# Patient Record
Sex: Male | Born: 1995 | Race: Black or African American | Hispanic: No | Marital: Single | Smoking: Current every day smoker
Health system: Southern US, Community
[De-identification: ages and names within clinical notes are randomized; demographics above are authoritative.]

## PROBLEM LIST (undated history)

## (undated) DIAGNOSIS — R569 Unspecified convulsions: Secondary | ICD-10-CM

## (undated) HISTORY — PX: OPEN REDUCTION INTERNAL FIXATION (ORIF) HAND: SHX5991

---
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
## 2017-10-25 ENCOUNTER — Encounter (HOSPITAL_BASED_OUTPATIENT_CLINIC_OR_DEPARTMENT_OTHER): Payer: Self-pay | Admitting: Emergency Medicine

## 2017-10-25 ENCOUNTER — Other Ambulatory Visit: Payer: Self-pay

## 2017-10-25 ENCOUNTER — Emergency Department (HOSPITAL_BASED_OUTPATIENT_CLINIC_OR_DEPARTMENT_OTHER)
Admission: EM | Admit: 2017-10-25 | Discharge: 2017-10-25 | Disposition: A | Discharge status: Home / Self Care | Payer: Self-pay | Attending: Emergency Medicine | Admitting: Emergency Medicine

## 2017-10-25 DIAGNOSIS — A64 Unspecified sexually transmitted disease: Secondary | ICD-10-CM | POA: Insufficient documentation

## 2017-10-25 DIAGNOSIS — N342 Other urethritis: Secondary | ICD-10-CM | POA: Insufficient documentation

## 2017-10-25 LAB — URINALYSIS, ROUTINE W REFLEX MICROSCOPIC
Bilirubin Urine: NEGATIVE
Glucose, UA: NEGATIVE mg/dL
Ketones, ur: NEGATIVE mg/dL
Nitrite: NEGATIVE
Protein, ur: NEGATIVE mg/dL
Specific Gravity, Urine: 1.02 (ref 1.005–1.030)
pH: 7 (ref 5.0–8.0)

## 2017-10-25 LAB — URINALYSIS, MICROSCOPIC (REFLEX)

## 2017-10-25 MED ORDER — AZITHROMYCIN 250 MG PO TABS
1000.0000 mg | ORAL_TABLET | Freq: Once | ORAL | Status: AC
  Administered 2017-10-25: 1000 mg via ORAL
  Filled 2017-10-25: qty 4

## 2017-10-25 MED ORDER — CEFTRIAXONE SODIUM 250 MG IJ SOLR
250.0000 mg | Freq: Once | INTRAMUSCULAR | Status: AC
  Administered 2017-10-25: 250 mg via INTRAMUSCULAR
  Filled 2017-10-25: qty 250

## 2017-10-25 MED ORDER — ONDANSETRON 4 MG PO TBDP
4.0000 mg | ORAL_TABLET | Freq: Once | ORAL | Status: AC
  Administered 2017-10-25: 4 mg via ORAL
  Filled 2017-10-25: qty 1

## 2017-10-25 NOTE — ED Triage Notes (Signed)
"   I think I have a STD" Dysuria , this am with d/c x 2 weeks.

## 2017-10-25 NOTE — ED Provider Notes (Signed)
[facility] EMERGENCY DEPARTMENT Provider Note   CSN: [id number] Arrival date & time: 10/25/17  0702     History   Chief Complaint No chief complaint on file.   HPI Ollen GrossRonald E Golaszewski is a 21 y.o. male.  21 year old male who presents with dysuria and penile discharge.  Patient reports 2 weeks of penile discharge and he began having dysuria this morning.  He is sexually active with one partner but states that she is not having any symptoms.  He does not always use condoms.  He has never had this problem before. No skin lesions or rash in groin.  He denies any other complaints.   The history is provided by the patient.    History reviewed. No pertinent past medical history.  There are no active problems to display for this patient.   Past Surgical History:  Procedure Laterality Date  . OPEN REDUCTION INTERNAL FIXATION (ORIF) HAND Left        Home Medications    Prior to Admission medications   Not on File    Family History No family history on file.  Social History Social History   Tobacco Use  . Smoking status: Not on file  Substance Use Topics  . Alcohol use: Not on file  . Drug use: Not on file     Allergies   Peanut-containing drug products   Review of Systems Review of Systems All other systems reviewed and are negative except that which was mentioned in HPI   Physical Exam Updated Vital Signs BP (!) 133/92 (BP Location: Left Arm)   Temp 98.1 F (36.7 C) (Oral)   Resp 18   Ht 6\' 1"  (1.854 m)   Wt 78.5 kg (173 lb)   SpO2 99%   BMI 22.82 kg/m   Physical Exam  Constitutional: He is oriented to person, place, and time. He appears well-developed and well-nourished. No distress.  HENT:  Head: Normocephalic and atraumatic.  Eyes: Conjunctivae are normal.  Neck: Neck supple.  Musculoskeletal:  Scar dorsal L hand  Neurological: He is alert and oriented to person, place, and time.  Skin: Skin is warm and dry.  Psychiatric: He has  a normal mood and affect. Judgment normal.  Nursing note and vitals reviewed.    ED Treatments / Results  Labs (all labs ordered are listed, but only abnormal results are displayed) Labs Reviewed  URINALYSIS, ROUTINE W REFLEX MICROSCOPIC - Abnormal; Notable for the following components:      Result Value   Hgb urine dipstick TRACE (*)    Leukocytes, UA SMALL (*)    All other components within normal limits  URINALYSIS, MICROSCOPIC (REFLEX)  GC/CHLAMYDIA PROBE AMP ([place]) NOT AT [facility]    EKG  EKG Interpretation None       Radiology No results found.  Procedures Procedures (including critical care time)  Medications Ordered in ED Medications  ondansetron (ZOFRAN-ODT) disintegrating tablet 4 mg (not administered)  azithromycin (ZITHROMAX) tablet 1,000 mg (not administered)  cefTRIAXone (ROCEPHIN) injection 250 mg (not administered)     Initial Impression / Assessment and Plan / ED Course  I have reviewed the triage vital signs and the nursing notes.  Pertinent labs that were available during my care of the patient were reviewed by me and considered in my medical decision making (see chart for details).     Sx c/w STD. Gave CTX, azithromycin. Discussed safe sex practices and the importance of getting his partner tested and treated prior to  any future intercourse.  Referred to health department for any future STD screening needs.  Final Clinical Impressions(s) / ED Diagnoses   Final diagnoses:  Urethritis  STI (sexually transmitted infection)    ED Discharge Orders    None       Roberth Berling, Ambrose Finlandachel Morgan, MD 10/25/17 [id number]

## 2017-10-26 LAB — GC/CHLAMYDIA PROBE AMP (~~LOC~~) NOT AT ARMC
Chlamydia: POSITIVE — AB
Neisseria Gonorrhea: NEGATIVE

## 2018-02-06 ENCOUNTER — Encounter (HOSPITAL_BASED_OUTPATIENT_CLINIC_OR_DEPARTMENT_OTHER): Payer: Self-pay | Admitting: Emergency Medicine

## 2018-02-06 ENCOUNTER — Other Ambulatory Visit: Payer: Self-pay

## 2018-02-06 ENCOUNTER — Emergency Department (HOSPITAL_BASED_OUTPATIENT_CLINIC_OR_DEPARTMENT_OTHER)
Admission: EM | Admit: 2018-02-06 | Discharge: 2018-02-06 | Disposition: A | Discharge status: Home / Self Care | Payer: Self-pay | Attending: Emergency Medicine | Admitting: Emergency Medicine

## 2018-02-06 DIAGNOSIS — Z5321 Procedure and treatment not carried out due to patient leaving prior to being seen by health care provider: Secondary | ICD-10-CM | POA: Insufficient documentation

## 2018-02-06 DIAGNOSIS — J029 Acute pharyngitis, unspecified: Secondary | ICD-10-CM | POA: Insufficient documentation

## 2018-02-06 LAB — RAPID STREP SCREEN (MED CTR MEBANE ONLY): Streptococcus, Group A Screen (Direct): NEGATIVE

## 2018-02-06 NOTE — ED Notes (Signed)
Pt registration notified this RN that patient walked outside. Checked treatment area several minutes after and pt not found.

## 2018-02-06 NOTE — ED Triage Notes (Signed)
Pt c/o sore throat and feeling like his throat is swollen.

## 2018-02-06 NOTE — ED Notes (Signed)
Pt not found in lobby or treatment area. Pt d/c out

## 2018-02-09 LAB — CULTURE, GROUP A STREP (THRC)

## 2020-10-28 ENCOUNTER — Emergency Department (HOSPITAL_COMMUNITY)

## 2020-10-28 ENCOUNTER — Encounter (HOSPITAL_COMMUNITY): Payer: Self-pay | Admitting: Emergency Medicine

## 2020-10-28 ENCOUNTER — Other Ambulatory Visit: Payer: Self-pay

## 2020-10-28 ENCOUNTER — Emergency Department (HOSPITAL_COMMUNITY)
Admission: EM | Admit: 2020-10-28 | Discharge: 2020-10-28 | Disposition: A | Discharge status: Home / Self Care | Source: Home / Self Care | Attending: Emergency Medicine | Admitting: Emergency Medicine

## 2020-10-28 DIAGNOSIS — E876 Hypokalemia: Secondary | ICD-10-CM | POA: Insufficient documentation

## 2020-10-28 DIAGNOSIS — F17210 Nicotine dependence, cigarettes, uncomplicated: Secondary | ICD-10-CM | POA: Insufficient documentation

## 2020-10-28 DIAGNOSIS — G40909 Epilepsy, unspecified, not intractable, without status epilepticus: Secondary | ICD-10-CM | POA: Diagnosis not present

## 2020-10-28 DIAGNOSIS — R569 Unspecified convulsions: Secondary | ICD-10-CM | POA: Diagnosis not present

## 2020-10-28 LAB — COMPREHENSIVE METABOLIC PANEL
ALT: 47 U/L — ABNORMAL HIGH (ref 0–44)
AST: 19 U/L (ref 15–41)
Albumin: 3.3 g/dL — ABNORMAL LOW (ref 3.5–5.0)
Alkaline Phosphatase: 40 U/L (ref 38–126)
Anion gap: 8 (ref 5–15)
BUN: 9 mg/dL (ref 6–20)
CO2: 21 mmol/L — ABNORMAL LOW (ref 22–32)
Calcium: 7.1 mg/dL — ABNORMAL LOW (ref 8.9–10.3)
Chloride: 114 mmol/L — ABNORMAL HIGH (ref 98–111)
Creatinine, Ser: 0.81 mg/dL (ref 0.61–1.24)
GFR, Estimated: >60 mL/min (ref >60)
Glucose, Bld: 72 mg/dL (ref 70–99)
Potassium: 3 mmol/L — ABNORMAL LOW (ref 3.5–5.1)
Sodium: 143 mmol/L (ref 135–145)
Total Bilirubin: 0.7 mg/dL (ref 0.3–1.2)
Total Protein: 5.7 g/dL — ABNORMAL LOW (ref 6.5–8.1)

## 2020-10-28 LAB — RAPID URINE DRUG SCREEN, HOSP PERFORMED
Amphetamines: POSITIVE — AB
Barbiturates: NOT DETECTED
Benzodiazepines: NOT DETECTED
Cocaine: NOT DETECTED
Opiates: NOT DETECTED
Tetrahydrocannabinol: NOT DETECTED

## 2020-10-28 LAB — CBC
HCT: 34.3 % — ABNORMAL LOW (ref 39.0–52.0)
Hemoglobin: 11.9 g/dL — ABNORMAL LOW (ref 13.0–17.0)
MCH: 31.6 pg (ref 26.0–34.0)
MCHC: 34.7 g/dL (ref 30.0–36.0)
MCV: 91 fL (ref 80.0–100.0)
Platelets: 214 10*3/uL (ref 150–400)
RBC: 3.77 MIL/uL — ABNORMAL LOW (ref 4.22–5.81)
RDW: 12.2 % (ref 11.5–15.5)
WBC: 7.3 10*3/uL (ref 4.0–10.5)
nRBC: 0 % (ref 0.0–0.2)

## 2020-10-28 MED ORDER — CALCIUM CARBONATE ANTACID 500 MG PO CHEW
2.0000 | CHEWABLE_TABLET | Freq: Once | ORAL | Status: AC
  Administered 2020-10-28: 400 mg via ORAL
  Filled 2020-10-28: qty 2

## 2020-10-28 MED ORDER — POTASSIUM CHLORIDE CRYS ER 20 MEQ PO TBCR
40.0000 meq | EXTENDED_RELEASE_TABLET | Freq: Once | ORAL | Status: AC
  Administered 2020-10-28: 40 meq via ORAL
  Filled 2020-10-28: qty 2

## 2020-10-28 MED ORDER — LEVETIRACETAM 500 MG PO TABS: 500.0000 mg | ORAL_TABLET | Freq: Two times a day (BID) | ORAL | 0 refills | Status: DC

## 2020-10-28 MED ORDER — LEVETIRACETAM IN NACL 1000 MG/100ML IV SOLN
1000.0000 mg | Freq: Once | INTRAVENOUS | Status: AC
  Administered 2020-10-28: 1000 mg via INTRAVENOUS
  Filled 2020-10-28: qty 100

## 2020-10-28 NOTE — ED Triage Notes (Signed)
[facility] arrived from jail via EMS. Pt had 2 unwitnessed seizures in his jail cell. 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 Pt denies any recent head trauma. Pt states that he hurt his right side with the seizure.

## 2020-10-28 NOTE — Discharge Instructions (Addendum)
It was our pleasure to provide your ER care today - we hope that you feel better.  Take keppra as prescribed. Follow up with neurologist in 1-2 weeks - call office tomorrow to arrange appointment.   Avoid substance abuse.   Seizure precautions - no driving, swimming, or operating heavy machinery until cleared to do so by your doctor.   From today's labs, your potassium level is mildly low (3) - eat plenty of fruits and vegetables, and follow up with primary care doctor in 1- 2 weeks.  Your calcium level is also mildly low - eat balanced diet, consider taking multivitamin, and follow up with your doctor.   Return to ER if worse, recurrent seizures, new or severe pain, trouble breathing, or other emergency concern.

## 2020-10-28 NOTE — ED Provider Notes (Signed)
[facility]-EMERGENCY DEPT Provider Note   CSN: [id number] Arrival date & time: 10/28/20  1949     History Chief Complaint  Patient presents with  . Seizures    Earl [name] is a 24 y.o. male.  Patient presents via EMS after seizure at jail. Patient notes hx seizures as child, and off seizure medication for a year or more. Denies other recent seizure. Cannot recall name of prior seizure med. 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 Denies etoh abuse. Currently in jail, recent abnormal sleep/wake cycle. Post seizure c/o right shoulder pain. Per report, seizure lasted 1-2 minutes, generalized, contusion to lower lip. No incontinence. ?brief post ictal period, but clear on arrival to ED. Denies headache. No neck or back pain. No abd pain or nvd. No fever or chills.   The history is provided by the patient.  Seizures      History reviewed. No pertinent past medical history.  There are no problems to display for this patient.   Past Surgical History:  Procedure Laterality Date  . OPEN REDUCTION INTERNAL FIXATION (ORIF) HAND Left        History reviewed. No pertinent family history.  Social History   Tobacco Use  . Smoking status: Current Every Day Smoker    Types: Cigarettes  . Smokeless tobacco: Never Used  Vaping Use  . Vaping Use: Never used  Substance Use Topics  . Alcohol use: Yes  . Drug use: No    Home Medications Prior to Admission medications   Medication Sig Start Date End Date Taking? Authorizing Provider  UNKNOWN TO PATIENT     [provider]    Allergies    Peanut-containing drug products  Review of Systems   Review of Systems  Constitutional: Negative for fever.  HENT: Negative for sore throat.   Eyes: Negative for redness.  Respiratory: Negative for cough and shortness of breath.   Cardiovascular: Negative for chest pain.  Gastrointestinal:  Negative for abdominal pain, diarrhea, nausea and vomiting.  Genitourinary: Negative for flank pain.  Musculoskeletal: Negative for back pain and neck pain.  Skin: Negative for rash and wound.  Neurological: Positive for seizures. Negative for headaches.  Hematological: Does not bruise/bleed easily.  Psychiatric/Behavioral: Negative for confusion.    Physical Exam Updated Vital Signs BP (!) 155/90   Pulse 89   Temp 98.6 F (37 C) (Oral)   Resp 14   Ht 1.854 m (6\' 1" )   Wt 77.1 kg   SpO2 100%   BMI 22.43 kg/m   Physical Exam Vitals and nursing note reviewed.  Constitutional:      Appearance: Normal appearance. He is well-developed.  HENT:     Head: Atraumatic.     Nose: Nose normal.     Mouth/Throat:     Mouth: Mucous membranes are moist.     Pharynx: Oropharynx is clear.     Comments: No oral/tongue trauma.  Eyes:     General: No scleral icterus.    Extraocular Movements: Extraocular movements intact.     Conjunctiva/sclera: Conjunctivae normal.     Pupils: Pupils are equal, round, and reactive to light.  Neck:     Vascular: No carotid bruit.     Trachea: No tracheal deviation.  Cardiovascular:     Rate and Rhythm: Normal rate and regular rhythm.     Pulses: Normal pulses.     Heart sounds: Normal heart sounds. No  murmur heard.  No friction rub. No gallop.   Pulmonary:     Effort: Pulmonary effort is normal. No accessory muscle usage or respiratory distress.     Breath sounds: Normal breath sounds.  Chest:     Chest wall: No tenderness.  Abdominal:     General: Bowel sounds are normal. There is no distension.     Palpations: Abdomen is soft.     Tenderness: There is no abdominal tenderness. There is no guarding.  Genitourinary:    Comments: No cva tenderness. Musculoskeletal:        General: No swelling.     Cervical back: Normal range of motion and neck supple. No rigidity or tenderness.     Comments: CTLS spine, non tender, aligned, no step  off. Tenderness right shoulder, no deformity. Otherwise good rom bil ext without pain or focal bony tenderness.   Skin:    General: Skin is warm and dry.     Findings: No rash.  Neurological:     Mental Status: He is alert.     Comments: Alert, speech clear. Motor intact bil. Sensation grossly intact bil. Steady gait.   Psychiatric:        Mood and Affect: Mood normal.     ED Results / Procedures / Treatments   Labs (all labs ordered are listed, but only abnormal results are displayed) Results for orders placed or performed during the hospital encounter of 10/28/20  CBC  Result Value Ref Range   WBC 7.3 4.0 - 10.5 K/uL   RBC 3.77 (L) 4.22 - 5.81 MIL/uL   Hemoglobin 11.9 (L) 13.0 - 17.0 g/dL   HCT 96.034.3 (L) 39 - 52 %   MCV 91.0 80.0 - 100.0 fL   MCH 31.6 26.0 - 34.0 pg   MCHC 34.7 30.0 - 36.0 g/dL   RDW 45.412.2 09.811.5 - 11.915.5 %   Platelets 214 150 - 400 K/uL   nRBC 0.0 0.0 - 0.2 %  Comprehensive metabolic panel  Result Value Ref Range   Sodium 143 135 - 145 mmol/L   Potassium 3.0 (L) 3.5 - 5.1 mmol/L   Chloride 114 (H) 98 - 111 mmol/L   CO2 21 (L) 22 - 32 mmol/L   Glucose, Bld 72 70 - 99 mg/dL   BUN 9 6 - 20 mg/dL   Creatinine, Ser 1.470.81 0.61 - 1.24 mg/dL   Calcium 7.1 (L) 8.9 - 10.3 mg/dL   Total Protein 5.7 (L) 6.5 - 8.1 g/dL   Albumin 3.3 (L) 3.5 - 5.0 g/dL   AST 19 15 - 41 U/L   ALT 47 (H) 0 - 44 U/L   Alkaline Phosphatase 40 38 - 126 U/L   Total Bilirubin 0.7 0.3 - 1.2 mg/dL   GFR, Estimated >82>60 >95>60 mL/min   Anion gap 8 5 - 15  Rapid urine drug screen (hospital performed)  Result Value Ref Range   Opiates NONE DETECTED NONE DETECTED   Cocaine NONE DETECTED NONE DETECTED   Benzodiazepines NONE DETECTED NONE DETECTED   Amphetamines POSITIVE (A) NONE DETECTED   Tetrahydrocannabinol NONE DETECTED NONE DETECTED   Barbiturates NONE DETECTED NONE DETECTED   DG Shoulder Right  Result Date: 10/28/2020 CLINICAL DATA:  Seizure today. Fell from bed onto a hard floor. Right  shoulder pain. EXAM: RIGHT SHOULDER - 2+ VIEW COMPARISON:  None. FINDINGS: There is no evidence of fracture or dislocation. There is no evidence of arthropathy or other focal bone abnormality. Soft tissues are unremarkable. IMPRESSION: Negative.  Electronically Signed   By: Amie Portland M.D.   On: 10/28/2020 20:44   CT HEAD WO CONTRAST  Result Date: 10/28/2020 CLINICAL DATA:  Unwitnessed seizure, history of seizures, most recently 1 year prior EXAM: CT HEAD WITHOUT CONTRAST TECHNIQUE: Contiguous axial images were obtained from the base of the skull through the vertex without intravenous contrast. COMPARISON:  Maxillofacial CT 11/19/2007 FINDINGS: Brain: No evidence of acute infarction, hemorrhage, hydrocephalus, extra-axial collection, visible mass lesion or mass effect. Vascular: No hyperdense vessel or unexpected calcification. Skull: No calvarial fracture or suspicious osseous lesion. No scalp swelling or hematoma. Sinuses/Orbits: Mural thickening in the paranasal sinuses most pronounced in the right ethmoids, frontal sinus and left sphenoid sinus and lateral recess. No layering air-fluid levels. Mastoid air cells are predominantly clear. Middle ear cavities are clear. Included orbital structures are unremarkable. Other: None IMPRESSION: 1. No acute intracranial findings. 2. Pansinus disease. Electronically Signed   By: Kreg Shropshire M.D.   On: 10/28/2020 20:51    EKG EKG Interpretation  Date/Time:  Sunday October 28 2020 21:17:15 EST Ventricular Rate:  83 PR Interval:    QRS Duration: 90 QT Interval:  360 QTC Calculation: 423 R Axis:   75 Text Interpretation: Sinus rhythm Confirmed by Cathren Laine ([id number]) on 10/28/2020 10:07:19 PM   Radiology DG Shoulder Right  Result Date: 10/28/2020 CLINICAL DATA:  Seizure today. Fell from bed onto a hard floor. Right shoulder pain. EXAM: RIGHT SHOULDER - 2+ VIEW COMPARISON:  None. FINDINGS: There is no evidence of fracture or dislocation. There is no  evidence of arthropathy or other focal bone abnormality. Soft tissues are unremarkable. IMPRESSION: Negative. Electronically Signed   By: Amie Portland M.D.   On: 10/28/2020 20:44   CT HEAD WO CONTRAST  Result Date: 10/28/2020 CLINICAL DATA:  Unwitnessed seizure, history of seizures, most recently 1 year prior EXAM: CT HEAD WITHOUT CONTRAST TECHNIQUE: Contiguous axial images were obtained from the base of the skull through the vertex without intravenous contrast. COMPARISON:  Maxillofacial CT 11/19/2007 FINDINGS: Brain: No evidence of acute infarction, hemorrhage, hydrocephalus, extra-axial collection, visible mass lesion or mass effect. Vascular: No hyperdense vessel or unexpected calcification. Skull: No calvarial fracture or suspicious osseous lesion. No scalp swelling or hematoma. Sinuses/Orbits: Mural thickening in the paranasal sinuses most pronounced in the right ethmoids, frontal sinus and left sphenoid sinus and lateral recess. No layering air-fluid levels. Mastoid air cells are predominantly clear. Middle ear cavities are clear. Included orbital structures are unremarkable. Other: None IMPRESSION: 1. No acute intracranial findings. 2. Pansinus disease. Electronically Signed   By: Kreg Shropshire M.D.   On: 10/28/2020 20:51    Procedures Procedures (including critical care time)  Medications Ordered in ED Medications - No data to display  ED Course  I have reviewed the triage vital signs and the nursing notes.  Pertinent labs & imaging results that were available during my care of the patient were reviewed by me and considered in my medical decision making (see chart for details).    MDM Rules/Calculators/A&P                          Iv ns. Continuous pulse ox and cardiac monitoring. Seizure precautions. Stat labs. Imaging.   Reviewed nursing notes and prior charts for additional history.   Keppra iv.   Labs reviewed/interpreted by me - K mildly low. kcl po.   Ct  reviewed/interpreted by me - no hem.  XR reviewed/interpreted by  me  - no fx.   Recheck pt, alert, content, no c/o. No seizure activity. No pain.   Pt currently appears stable for d/c. Neurology f/u. Seizure precautions/no driving, etc.   Return precautions provided.    Final Clinical Impression(s) / ED Diagnoses Final diagnoses:  None    Rx / DC Orders ED Discharge Orders    None       Cathren Laine, MD 10/28/20 2208

## 2020-10-29 ENCOUNTER — Emergency Department (HOSPITAL_COMMUNITY)

## 2020-10-29 ENCOUNTER — Encounter (HOSPITAL_COMMUNITY): Payer: Self-pay | Admitting: Emergency Medicine

## 2020-10-29 ENCOUNTER — Inpatient Hospital Stay (HOSPITAL_COMMUNITY)
Admission: EM | Admit: 2020-10-29 | Discharge: 2020-10-31 | DRG: 101 | Attending: Family Medicine | Admitting: Family Medicine

## 2020-10-29 DIAGNOSIS — F1510 Other stimulant abuse, uncomplicated: Secondary | ICD-10-CM | POA: Diagnosis not present

## 2020-10-29 DIAGNOSIS — D649 Anemia, unspecified: Secondary | ICD-10-CM | POA: Diagnosis present

## 2020-10-29 DIAGNOSIS — Z79899 Other long term (current) drug therapy: Secondary | ICD-10-CM

## 2020-10-29 DIAGNOSIS — R000 Tachycardia, unspecified: Secondary | ICD-10-CM | POA: Diagnosis present

## 2020-10-29 DIAGNOSIS — R569 Unspecified convulsions: Secondary | ICD-10-CM

## 2020-10-29 DIAGNOSIS — E860 Dehydration: Secondary | ICD-10-CM | POA: Diagnosis present

## 2020-10-29 DIAGNOSIS — F17210 Nicotine dependence, cigarettes, uncomplicated: Secondary | ICD-10-CM | POA: Diagnosis present

## 2020-10-29 DIAGNOSIS — Z20822 Contact with and (suspected) exposure to covid-19: Secondary | ICD-10-CM | POA: Diagnosis present

## 2020-10-29 DIAGNOSIS — Z91010 Allergy to peanuts: Secondary | ICD-10-CM

## 2020-10-29 DIAGNOSIS — I10 Essential (primary) hypertension: Secondary | ICD-10-CM | POA: Diagnosis present

## 2020-10-29 DIAGNOSIS — M25511 Pain in right shoulder: Secondary | ICD-10-CM | POA: Diagnosis present

## 2020-10-29 DIAGNOSIS — G40909 Epilepsy, unspecified, not intractable, without status epilepticus: Principal | ICD-10-CM | POA: Diagnosis present

## 2020-10-29 DIAGNOSIS — F1590 Other stimulant use, unspecified, uncomplicated: Secondary | ICD-10-CM | POA: Diagnosis present

## 2020-10-29 HISTORY — DX: Unspecified convulsions: R56.9

## 2020-10-29 LAB — CBC
HCT: 44.9 % (ref 39.0–52.0)
Hemoglobin: 15.6 g/dL (ref 13.0–17.0)
MCH: 31 pg (ref 26.0–34.0)
MCHC: 34.7 g/dL (ref 30.0–36.0)
MCV: 89.1 fL (ref 80.0–100.0)
Platelets: 308 10*3/uL (ref 150–400)
RBC: 5.04 MIL/uL (ref 4.22–5.81)
RDW: 12.2 % (ref 11.5–15.5)
WBC: 7.9 10*3/uL (ref 4.0–10.5)
nRBC: 0 % (ref 0.0–0.2)

## 2020-10-29 LAB — COMPREHENSIVE METABOLIC PANEL
ALT: 55 U/L — ABNORMAL HIGH (ref 0–44)
AST: 26 U/L (ref 15–41)
Albumin: 4.3 g/dL (ref 3.5–5.0)
Alkaline Phosphatase: 45 U/L (ref 38–126)
Anion gap: 10 (ref 5–15)
BUN: 10 mg/dL (ref 6–20)
CO2: 25 mmol/L (ref 22–32)
Calcium: 8.9 mg/dL (ref 8.9–10.3)
Chloride: 106 mmol/L (ref 98–111)
Creatinine, Ser: 1.13 mg/dL (ref 0.61–1.24)
GFR, Estimated: >60 mL/min (ref >60)
Glucose, Bld: 100 mg/dL — ABNORMAL HIGH (ref 70–99)
Potassium: 3.7 mmol/L (ref 3.5–5.1)
Sodium: 141 mmol/L (ref 135–145)
Total Bilirubin: 1.1 mg/dL (ref 0.3–1.2)
Total Protein: 7.1 g/dL (ref 6.5–8.1)

## 2020-10-29 LAB — HIV ANTIBODY (ROUTINE TESTING W REFLEX): HIV Screen 4th Generation wRfx: NONREACTIVE

## 2020-10-29 LAB — RAPID URINE DRUG SCREEN, HOSP PERFORMED
Amphetamines: NOT DETECTED
Barbiturates: NOT DETECTED
Benzodiazepines: NOT DETECTED
Cocaine: NOT DETECTED
Opiates: NOT DETECTED
Tetrahydrocannabinol: NOT DETECTED

## 2020-10-29 LAB — RESP PANEL BY RT-PCR (FLU A&B, COVID) ARPGX2
Influenza A by PCR: NEGATIVE
Influenza B by PCR: NEGATIVE
SARS Coronavirus 2 by RT PCR: NEGATIVE

## 2020-10-29 MED ORDER — LORAZEPAM 2 MG/ML IJ SOLN
2.0000 mg | Freq: Four times a day (QID) | INTRAMUSCULAR | Status: DC | PRN
  Administered 2020-10-29: 2 mg via INTRAVENOUS
  Filled 2020-10-29: qty 1

## 2020-10-29 MED ORDER — ACETAMINOPHEN 325 MG PO TABS
650.0000 mg | ORAL_TABLET | Freq: Four times a day (QID) | ORAL | Status: DC | PRN
  Administered 2020-10-29 – 2020-10-30 (×3): 650 mg via ORAL
  Filled 2020-10-29 (×3): qty 2

## 2020-10-29 MED ORDER — SODIUM CHLORIDE 0.9 % IV SOLN: INTRAVENOUS | Status: DC

## 2020-10-29 MED ORDER — ENOXAPARIN SODIUM 40 MG/0.4ML ~~LOC~~ SOLN
40.0000 mg | SUBCUTANEOUS | Status: DC
  Administered 2020-10-29 – 2020-10-30 (×2): 40 mg via SUBCUTANEOUS
  Filled 2020-10-29 (×2): qty 0.4

## 2020-10-29 MED ORDER — ACETAMINOPHEN 325 MG PO TABS
650.0000 mg | ORAL_TABLET | Freq: Once | ORAL | Status: AC
  Administered 2020-10-29: 650 mg via ORAL
  Filled 2020-10-29 (×2): qty 2

## 2020-10-29 MED ORDER — LORAZEPAM 2 MG/ML IJ SOLN
1.0000 mg | Freq: Once | INTRAMUSCULAR | Status: AC
  Administered 2020-10-29: 1 mg via INTRAVENOUS
  Filled 2020-10-29: qty 1

## 2020-10-29 MED ORDER — HYDRALAZINE HCL 20 MG/ML IJ SOLN
10.0000 mg | Freq: Four times a day (QID) | INTRAMUSCULAR | Status: DC | PRN
  Administered 2020-10-29: 10 mg via INTRAVENOUS
  Filled 2020-10-29: qty 1

## 2020-10-29 MED ORDER — LEVETIRACETAM IN NACL 500 MG/100ML IV SOLN
500.0000 mg | Freq: Two times a day (BID) | INTRAVENOUS | Status: DC
  Administered 2020-10-29 – 2020-10-30 (×2): 500 mg via INTRAVENOUS
  Filled 2020-10-29 (×2): qty 100

## 2020-10-29 NOTE — ED Triage Notes (Signed)
BIB EMS from [facility], C/C 3 witnessed seizures,lasting about 30 seconds each, 5-7 min apart. Alert and oriented with EMS currently, was post-ictal after seizures. Had one episode of urinary incontinence.

## 2020-10-29 NOTE — ED Notes (Signed)
Pt given a meal tray 

## 2020-10-29 NOTE — ED Notes (Signed)
Seizure like activity noted by deputy pt postictal on my arrival will give PRN ativan per order condition now stable

## 2020-10-29 NOTE — H&P (Signed)
History and Physical    Earl [name] ION:[id number] DOB: 1996-08-19 DOA: 10/29/2020  PCP: Patient, No Pcp Per   Patient coming from: Jail    Chief Complaint: Seizure  HPI: Earl GrossRonald E [name] is a 24 y.o. male with medical history significant of seizure disorder but not taking any antiseizure medications who was brought from jail with 3 witnessed  events of tonic clonic seizures.  Patient actually presented here with same complaint on 10/28/2020, he was loaded with Keppra in the emergency department and was discharged.  After discharge, there was report that he had 3 more seizure episodes in the jail.  Patient has history of seizure disorder since childhood but was not taking his medication and he cannot remember the name of the medication he was taking before.  UDS done in the emergency department on 10/28/2020 showed amphetamines.  As per the report, the seizures lasted about 30 seconds and were 7 minutes apart.  Patient became postictal after seizure and also had an episode of urinary incontinence. There was report that patient hit his head during the seizure episode and was complaining of neck pain and low back pain with headache after the seizure. Patient seen and examined at the bedside in the emergency department.  During my evaluation, he was hemodynamically stable.  He was in custody. There was no report of fever, chills, nausea, vomiting, abdominal pain, dysuria, diarrhea, hematochezia.  Patient complained of headache and generalized body ache during my evaluation.  ED Course: Noted to be hypertensive and tachycardiac in the emergency department.  CT head/cervical scan/x-ray of the lumbar spine did not show any acute abnormalities.  Patient was admitted for observation.  Started on IV Keppra.  EEG ordered.  Review of Systems: As per HPI otherwise 10 point review of systems negative.    Past Medical History:  Diagnosis Date  . Seizures (HCC)     Past Surgical History:  Procedure  Laterality Date  . OPEN REDUCTION INTERNAL FIXATION (ORIF) HAND Left      reports that he has been smoking cigarettes. He has never used smokeless tobacco. He reports current alcohol use. He reports that he does not use drugs.  Allergies  Allergen Reactions  . Peanut-Containing Drug Products Anaphylaxis    hives    No family history on file.   Prior to Admission medications   Medication Sig Start Date End Date Taking? Authorizing Provider  acetaminophen (TYLENOL) 325 MG tablet Take 650 mg by mouth 3 (three) times daily as needed for moderate pain.   Yes [provider]  loperamide (IMODIUM A-D) 2 MG tablet Take 2 mg by mouth 2 (two) times daily as needed for diarrhea or loose stools.   Yes [provider]  Meclizine HCl 25 MG CHEW Chew 25 mg by mouth 3 (three) times daily as needed (nausea).   Yes [provider]  levETIRAcetam (KEPPRA) 500 MG tablet Take 1 tablet (500 mg total) by mouth 2 (two) times daily. 10/28/20   Cathren LaineSteinl, Kevin, MD  UNKNOWN TO PATIENT     [provider]    Physical Exam: Vitals:   10/29/20 1230 10/29/20 1405 10/29/20 1406 10/29/20 1545  BP:  (!) 128/94  (!) 149/112  Pulse:  95 96 95  Resp: 17 19 20  (!) 24  Temp:      TempSrc:      SpO2:  98% 97% 96%  Weight:      Height:        Constitutional:  anxious  Vitals:   10/29/20 1230 10/29/20 1405 10/29/20 1406 10/29/20 1545  BP:  (!) 128/94  (!) 149/112  Pulse:  95 96 95  Resp: 17 19 20  (!) 24  Temp:      TempSrc:      SpO2:  98% 97% 96%  Weight:      Height:       Eyes: PERRL, lids and conjunctivae normal ENMT: Mucous membranes are moist.  Neck: normal, supple, no masses, no thyromegaly Respiratory: clear to auscultation bilaterally, no wheezing, no crackles. Normal respiratory effort. No accessory muscle use.  Cardiovascular: Sinus tachcardia, no murmurs / rubs / gallops. No extremity edema.  Abdomen: no tenderness, no masses palpated. No hepatosplenomegaly.  Bowel sounds positive.  Musculoskeletal: no clubbing / cyanosis. No joint deformity upper and lower extremities.  Skin: no rashes, lesions, ulcers. No induration Neurologic: CN 2-12 grossly intact.  Strength 5/5 in all 4.  Psychiatric: Anxious. Alert and oriented x 3. Normal mood.   Foley Catheter:None  Labs on Admission: I have personally reviewed following labs and imaging studies  CBC: Recent Labs  Lab 10/28/20 2019  WBC 7.3  HGB 11.9*  HCT 34.3*  MCV 91.0  PLT 214   Basic Metabolic Panel: Recent Labs  Lab 10/28/20 2019 10/29/20 1300  NA 143 141  K 3.0* 3.7  CL 114* 106  CO2 21* 25  GLUCOSE 72 100*  BUN 9 10  CREATININE 0.81 1.13  CALCIUM 7.1* 8.9   GFR: Estimated Creatinine Clearance: 107.9 mL/min (by C-G formula based on SCr of 1.13 mg/dL). Liver Function Tests: Recent Labs  Lab 10/28/20 2019 10/29/20 1300  AST 19 26  ALT 47* 55*  ALKPHOS 40 45  BILITOT 0.7 1.1  PROT 5.7* 7.1  ALBUMIN 3.3* 4.3   No results for input(s): LIPASE, AMYLASE in the last 168 hours. No results for input(s): AMMONIA in the last 168 hours. Coagulation Profile: No results for input(s): INR, PROTIME in the last 168 hours. Cardiac Enzymes: No results for input(s): CKTOTAL, CKMB, CKMBINDEX, TROPONINI in the last 168 hours. BNP (last 3 results) No results for input(s): PROBNP in the last 8760 hours. HbA1C: No results for input(s): HGBA1C in the last 72 hours. CBG: No results for input(s): GLUCAP in the last 168 hours. Lipid Profile: No results for input(s): CHOL, HDL, LDLCALC, TRIG, CHOLHDL, LDLDIRECT in the last 72 hours. Thyroid Function Tests: No results for input(s): TSH, T4TOTAL, FREET4, T3FREE, THYROIDAB in the last 72 hours. Anemia Panel: No results for input(s): VITAMINB12, FOLATE, FERRITIN, TIBC, IRON, RETICCTPCT in the last 72 hours. Urine analysis:    Component Value Date/Time   COLORURINE YELLOW 10/25/2017 0723   APPEARANCEUR CLEAR 10/25/2017 0723   LABSPEC  1.020 10/25/2017 0723   PHURINE 7.0 10/25/2017 0723   GLUCOSEU NEGATIVE 10/25/2017 0723   HGBUR TRACE (A) 10/25/2017 0723   BILIRUBINUR NEGATIVE 10/25/2017 0723   KETONESUR NEGATIVE 10/25/2017 0723   PROTEINUR NEGATIVE 10/25/2017 0723   NITRITE NEGATIVE 10/25/2017 0723   LEUKOCYTESUR SMALL (A) 10/25/2017 0723    Radiological Exams on Admission: DG Lumbar Spine Complete  Result Date: 10/29/2020 CLINICAL DATA:  Seizures, fall EXAM: LUMBAR SPINE - COMPLETE 4+ VIEW COMPARISON:  None. FINDINGS: Lumbar vertebral body heights and alignment are preserved. There is no acute fracture. There is no evidence of pars break. Intervertebral disc spaces are preserved. There is no significant facet hypertrophy. Sacroiliac joints are unremarkable. IMPRESSION: No acute or significant abnormality. Electronically Signed   By: 14/04/2020.D.  On: 10/29/2020 13:22   DG Shoulder Right  Result Date: 10/28/2020 CLINICAL DATA:  Seizure today. Fell from bed onto a hard floor. Right shoulder pain. EXAM: RIGHT SHOULDER - 2+ VIEW COMPARISON:  None. FINDINGS: There is no evidence of fracture or dislocation. There is no evidence of arthropathy or other focal bone abnormality. Soft tissues are unremarkable. IMPRESSION: Negative. Electronically Signed   By: Amie Portland M.D.   On: 10/28/2020 20:44   CT Head Wo Contrast  Result Date: 10/29/2020 CLINICAL DATA:  Three witnessed seizures about 30 seconds each occurring 5-7 minutes apart, was post ictal following seizures but now alert and oriented, single episode urinary incontinence EXAM: CT HEAD WITHOUT CONTRAST CT CERVICAL SPINE WITHOUT CONTRAST TECHNIQUE: Multidetector CT imaging of the head and cervical spine was performed following the standard protocol without intravenous contrast. Multiplanar CT image reconstructions of the cervical spine were also generated. COMPARISON:  10/28/2020 CT head FINDINGS: CT HEAD FINDINGS Brain: Normal ventricular morphology. No midline  shift or mass effect. Normal appearance of brain parenchyma. No intracranial hemorrhage, mass lesion, evidence of acute infarction, or extra-axial fluid collection. Vascular: No hyperdense vessels Skull: Intact Sinuses/Orbits: Opacified RIGHT frontal sinus and LEFT sphenoid sinus unchanged. Other: N/A CT CERVICAL SPINE FINDINGS Alignment: Normal Skull base and vertebrae: Osseous mineralization normal. Skull base intact. Vertebral body and disc space heights maintained. Facet alignments normal. No fracture, subluxation, or bone destruction. Soft tissues and spinal canal: Prevertebral soft tissues normal thickness. Large calcification LEFT submandibular gland question sialolith. Disc levels:  No specific abnormalities Upper chest: Lung apices clear Other: N/A IMPRESSION: No acute intracranial abnormalities. 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 No acute cervical spine abnormalities. Large calcification in LEFT submandibular gland question sialolith. Electronically Signed   By: Ulyses Southward M.D.   On: 10/29/2020 15:09   CT HEAD WO CONTRAST  Result Date: 10/28/2020 CLINICAL DATA:  Unwitnessed seizure, history of seizures, most recently 1 year prior EXAM: CT HEAD WITHOUT CONTRAST TECHNIQUE: Contiguous axial images were obtained from the base of the skull through the vertex without intravenous contrast. COMPARISON:  Maxillofacial CT 11/19/2007 FINDINGS: Brain: No evidence of acute infarction, hemorrhage, hydrocephalus, extra-axial collection, visible mass lesion or mass effect. Vascular: No hyperdense vessel or unexpected calcification. Skull: No calvarial fracture or suspicious osseous lesion. No scalp swelling or hematoma. Sinuses/Orbits: Mural thickening in the paranasal sinuses most pronounced in the right ethmoids, frontal sinus and left sphenoid sinus and lateral recess. No layering air-fluid levels. Mastoid air cells are predominantly clear. Middle ear cavities are clear. Included orbital  structures are unremarkable. Other: None IMPRESSION: 1. No acute intracranial findings. 2. Pansinus disease. Electronically Signed   By: Kreg Shropshire M.D.   On: 10/28/2020 20:51   CT Cervical Spine Wo Contrast  Result Date: 10/29/2020 CLINICAL DATA:  Three witnessed seizures about 30 seconds each occurring 5-7 minutes apart, was post ictal following seizures but now alert and oriented, single episode urinary incontinence EXAM: CT HEAD WITHOUT CONTRAST CT CERVICAL SPINE WITHOUT CONTRAST TECHNIQUE: Multidetector CT imaging of the head and cervical spine was performed following the standard protocol without intravenous contrast. Multiplanar CT image reconstructions of the cervical spine were also generated. COMPARISON:  10/28/2020 CT head FINDINGS: CT HEAD FINDINGS Brain: Normal ventricular morphology. No midline shift or mass effect. Normal appearance of brain parenchyma. No intracranial hemorrhage, mass lesion, evidence of acute infarction, or extra-axial fluid collection. Vascular: No hyperdense vessels Skull: Intact Sinuses/Orbits: Opacified RIGHT frontal sinus and LEFT sphenoid sinus unchanged.  Other: N/A CT CERVICAL SPINE FINDINGS Alignment: Normal Skull base and vertebrae: Osseous mineralization normal. Skull base intact. Vertebral body and disc space heights maintained. Facet alignments normal. No fracture, subluxation, or bone destruction. Soft tissues and spinal canal: Prevertebral soft tissues normal thickness. Large calcification LEFT submandibular gland question sialolith. Disc levels:  No specific abnormalities Upper chest: Lung apices clear Other: N/A IMPRESSION: No acute intracranial abnormalities. 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 No acute cervical spine abnormalities. Large calcification in LEFT submandibular gland question sialolith. Electronically Signed   By: Ulyses Southward M.D.   On: 10/29/2020 15:09     Assessment/Plan Active Problems:   Seizure (HCC)    Seizures:  History of seizure disorder but currently not taking medications.  Had 2  seizure episodes yesterday and 3 today.  The patient was in ED yesterday and was discharged after loaded with Keppra. We will get EEG.  We will continue Keppra IV.  Seizure precaution.  IV Ativan for breakthrough seizures. Imaging bloods did not show any acute abnormality.  Sinus tachycardia: Continue gentle IV fluid:s  Substance abuse: Amphetamines positive in the UDS on 10/28/2020.  3 new seizure episodes today in the jail.  We will repeat UDS  Hypertensive: Most likely secondary to postictal state/anxiety.Continue to monitor.Continue PRN meds   Severity of Illness: The appropriate patient status for this patient is OBSERVATION.    DVT prophylaxis: Lovenox Code Status: Full Family Communication: None at bedside Consults called:ED physician discussed with neurology on phone     Burnadette Pop MD [facility]  10/29/2020, 4:20 PM

## 2020-10-29 NOTE — ED Provider Notes (Signed)
[facility]-EMERGENCY DEPT Provider Note   CSN: [id number] Arrival date & time: 10/29/20  1136     History No chief complaint on file.   Earl [name] is a 24 y.o. male with pertinent past medical history of seizures that presents via EMS after 3 witnessed seizures at jail.  Patient was seen in the emergency department yesterday and evaluated for the same, yesterday he did have 2 seizures, was loaded with Keppra and sent home with neurology follow-up.  Return to this morning with 3 witnessed seizures, per EMS they lasted about 30 seconds, they were about 7 minutes apart.  EMS also noticed that there was a postictal period after seizures and he also did have 1 episode of urinary incontinence.  Patient states that he did hit his head, is complaining currently of neck pain and lower back pain and HA.  Patient states that he has not had seizures before yesterday for the past couple of years.  Has not been taking his seizure medication.  Denies any drug use besides amphetamines.  Denies any alcohol use.  States last time he used it that means is over a month ago.  Patient did have urine drug screen yesterday which did show amphetamines only.  Denies any nausea, vomiting, abdominal pain, fevers, chills, vision changes.  Patient states that he is having a headache, generalized all over his head.  Denies any shoulder pain.  HPI     Past Medical History:  Diagnosis Date  . Seizures (HCC)     There are no problems to display for this patient.   Past Surgical History:  Procedure Laterality Date  . OPEN REDUCTION INTERNAL FIXATION (ORIF) HAND Left        No family history on file.  Social History   Tobacco Use  . Smoking status: Current Every Day Smoker    Types: Cigarettes  . Smokeless tobacco: Never Used  Vaping Use  . Vaping Use: Never used  Substance Use Topics  . Alcohol use: Yes  . Drug use: No    Home Medications Prior to Admission medications    Medication Sig Start Date End Date Taking? Authorizing Provider  levETIRAcetam (KEPPRA) 500 MG tablet Take 1 tablet (500 mg total) by mouth 2 (two) times daily. 10/28/20   Cathren Laine, MD  UNKNOWN TO PATIENT     [provider]    Allergies    Peanut-containing drug products  Review of Systems   Review of Systems  Constitutional: Negative for chills, diaphoresis, fatigue and fever.  HENT: Negative for congestion, sore throat and trouble swallowing.   Eyes: Negative for pain and visual disturbance.  Respiratory: Negative for cough, shortness of breath and wheezing.   Cardiovascular: Negative for chest pain, palpitations and leg swelling.  Gastrointestinal: Negative for abdominal distention, abdominal pain, diarrhea, nausea and vomiting.  Genitourinary: Negative for difficulty urinating.  Musculoskeletal: Positive for back pain and neck pain. Negative for neck stiffness.  Skin: Negative for pallor.  Neurological: Positive for seizures. Negative for dizziness, speech difficulty, weakness and headaches.  Psychiatric/Behavioral: Negative for confusion.    Physical Exam Updated Vital Signs BP (!) 149/112   Pulse 95   Temp (!) 97.2 F (36.2 C) (Oral)   Resp (!) 24   Ht 6\' 1"  (1.854 m)   Wt 77 kg   SpO2 96%   BMI 22.40 kg/m   Physical Exam Constitutional:      General: He is not in acute distress.  Appearance: Normal appearance. He is not ill-appearing, toxic-appearing or diaphoretic.  HENT:     Mouth/Throat:     Mouth: Mucous membranes are moist.     Pharynx: Oropharynx is clear.  Eyes:     General: No scleral icterus.    Extraocular Movements: Extraocular movements intact.     Pupils: Pupils are equal, round, and reactive to light.  Neck:      Comments: Midline tenderness to cervical spine, no para spinal muscle tenderness.  Full range of motion to neck with pain.  No crepitus. Cardiovascular:     Rate and Rhythm: Normal rate and regular rhythm.      Pulses: Normal pulses.     Heart sounds: Normal heart sounds.  Pulmonary:     Effort: Pulmonary effort is normal. No respiratory distress.     Breath sounds: Normal breath sounds. No stridor. No wheezing, rhonchi or rales.  Chest:     Chest wall: No tenderness.  Abdominal:     General: Abdomen is flat. There is no distension.     Palpations: Abdomen is soft.     Tenderness: There is no abdominal tenderness. There is no guarding or rebound.  Musculoskeletal:        General: No swelling or tenderness. Normal range of motion.     Cervical back: Normal range of motion and neck supple. No erythema or rigidity. Spinous process tenderness present. Normal range of motion.       Back:     Right lower leg: No edema.     Left lower leg: No edema.     Comments: Tenderness to palpation along lumbar spine with midline tenderness, normal range of motion to back, no objective numbness.  Skin:    General: Skin is warm and dry.     Capillary Refill: Capillary refill takes less than 2 seconds.     Coloration: Skin is not pale.  Neurological:     General: No focal deficit present.     Mental Status: He is alert and oriented to person, place, and time.  Psychiatric:        Mood and Affect: Mood normal.        Behavior: Behavior normal.     ED Results / Procedures / Treatments   Labs (all labs ordered are listed, but only abnormal results are displayed) Labs Reviewed  COMPREHENSIVE METABOLIC PANEL - Abnormal; Notable for the following components:      Result Value   Glucose, Bld 100 (*)    ALT 55 (*)    All other components within normal limits  CBC    EKG None  Radiology DG Lumbar Spine Complete  Result Date: 10/29/2020 CLINICAL DATA:  Seizures, fall EXAM: LUMBAR SPINE - COMPLETE 4+ VIEW COMPARISON:  None. FINDINGS: Lumbar vertebral body heights and alignment are preserved. There is no acute fracture. There is no evidence of pars break. Intervertebral disc spaces are preserved. There is  no significant facet hypertrophy. Sacroiliac joints are unremarkable. IMPRESSION: No acute or significant abnormality. Electronically Signed   By: Guadlupe SpanishPraneil  Madison Albea M.D.   On: 10/29/2020 13:22   DG Shoulder Right  Result Date: 10/28/2020 CLINICAL DATA:  Seizure today. Fell from bed onto a hard floor. Right shoulder pain. EXAM: RIGHT SHOULDER - 2+ VIEW COMPARISON:  None. FINDINGS: There is no evidence of fracture or dislocation. There is no evidence of arthropathy or other focal bone abnormality. Soft tissues are unremarkable. IMPRESSION: Negative. Electronically Signed   By: Renard Hamperavid  Ormond M.D.  On: 10/28/2020 20:44   CT Head Wo Contrast  Result Date: 10/29/2020 CLINICAL DATA:  Three witnessed seizures about 30 seconds each occurring 5-7 minutes apart, was post ictal following seizures but now alert and oriented, single episode urinary incontinence EXAM: CT HEAD WITHOUT CONTRAST CT CERVICAL SPINE WITHOUT CONTRAST TECHNIQUE: Multidetector CT imaging of the head and cervical spine was performed following the standard protocol without intravenous contrast. Multiplanar CT image reconstructions of the cervical spine were also generated. COMPARISON:  10/28/2020 CT head FINDINGS: CT HEAD FINDINGS Brain: Normal ventricular morphology. No midline shift or mass effect. Normal appearance of brain parenchyma. No intracranial hemorrhage, mass lesion, evidence of acute infarction, or extra-axial fluid collection. Vascular: No hyperdense vessels Skull: Intact Sinuses/Orbits: Opacified RIGHT frontal sinus and LEFT sphenoid sinus unchanged. Other: N/A CT CERVICAL SPINE FINDINGS Alignment: Normal Skull base and vertebrae: Osseous mineralization normal. Skull base intact. Vertebral body and disc space heights maintained. Facet alignments normal. No fracture, subluxation, or bone destruction. Soft tissues and spinal canal: Prevertebral soft tissues normal thickness. Large calcification LEFT submandibular gland question sialolith.  Disc levels:  No specific abnormalities Upper chest: Lung apices clear Other: N/A IMPRESSION: No acute intracranial abnormalities. 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 No acute cervical spine abnormalities. Large calcification in LEFT submandibular gland question sialolith. Electronically Signed   By: Ulyses Southward M.D.   On: 10/29/2020 15:09   CT HEAD WO CONTRAST  Result Date: 10/28/2020 CLINICAL DATA:  Unwitnessed seizure, history of seizures, most recently 1 year prior EXAM: CT HEAD WITHOUT CONTRAST TECHNIQUE: Contiguous axial images were obtained from the base of the skull through the vertex without intravenous contrast. COMPARISON:  Maxillofacial CT 11/19/2007 FINDINGS: Brain: No evidence of acute infarction, hemorrhage, hydrocephalus, extra-axial collection, visible mass lesion or mass effect. Vascular: No hyperdense vessel or unexpected calcification. Skull: No calvarial fracture or suspicious osseous lesion. No scalp swelling or hematoma. Sinuses/Orbits: Mural thickening in the paranasal sinuses most pronounced in the right ethmoids, frontal sinus and left sphenoid sinus and lateral recess. No layering air-fluid levels. Mastoid air cells are predominantly clear. Middle ear cavities are clear. Included orbital structures are unremarkable. Other: None IMPRESSION: 1. No acute intracranial findings. 2. Pansinus disease. Electronically Signed   By: Kreg Shropshire M.D.   On: 10/28/2020 20:51   CT Cervical Spine Wo Contrast  Result Date: 10/29/2020 CLINICAL DATA:  Three witnessed seizures about 30 seconds each occurring 5-7 minutes apart, was post ictal following seizures but now alert and oriented, single episode urinary incontinence EXAM: CT HEAD WITHOUT CONTRAST CT CERVICAL SPINE WITHOUT CONTRAST TECHNIQUE: Multidetector CT imaging of the head and cervical spine was performed following the standard protocol without intravenous contrast. Multiplanar CT image reconstructions of the cervical  spine were also generated. COMPARISON:  10/28/2020 CT head FINDINGS: CT HEAD FINDINGS Brain: Normal ventricular morphology. No midline shift or mass effect. Normal appearance of brain parenchyma. No intracranial hemorrhage, mass lesion, evidence of acute infarction, or extra-axial fluid collection. Vascular: No hyperdense vessels Skull: Intact Sinuses/Orbits: Opacified RIGHT frontal sinus and LEFT sphenoid sinus unchanged. Other: N/A CT CERVICAL SPINE FINDINGS Alignment: Normal Skull base and vertebrae: Osseous mineralization normal. Skull base intact. Vertebral body and disc space heights maintained. Facet alignments normal. No fracture, subluxation, or bone destruction. Soft tissues and spinal canal: Prevertebral soft tissues normal thickness. Large calcification LEFT submandibular gland question sialolith. Disc levels:  No specific abnormalities Upper chest: Lung apices clear Other: N/A IMPRESSION: No acute intracranial abnormalities. Chronic RIGHT frontal and  LEFT sphenoid sinus disease. No acute cervical spine abnormalities. Large calcification in LEFT submandibular gland question sialolith. Electronically Signed   By: Ulyses Southward M.D.   On: 10/29/2020 15:09    Procedures Procedures (including critical care time)  Medications Ordered in ED Medications  LORazepam (ATIVAN) injection 1 mg (1 mg Intravenous Given 10/29/20 1333)  acetaminophen (TYLENOL) tablet 650 mg (650 mg Oral Given 10/29/20 1517)    ED Course  I have reviewed the triage vital signs and the nursing notes.  Pertinent labs & imaging results that were available during my care of the patient were reviewed by me and considered in my medical decision making (see chart for details).    MDM Rules/Calculators/A&P                          Earl [name] is a 24 y.o. male with pertinent past medical history of seizures that presents via EMS after 3 witnessed seizures at jail.  Will give Ativan at this time, labs and imaging ordered.  CMP  unremarkable, patient did have amphetamines in urine yesterday, however when speaking patient he states that he has not had amphetamines in the past couple of weeks, has not had any while in jail overnight.  Imaging without any acute fracture or dislocation of back, CT head and neck negative for fracture any intracranial abnormality.  We will consult neurology at this time since patient did have 3 seizures this morning with 2 seizures yesterday. Has not had any seizures while here.   400 spoke to Dr. [name], neurology who recommended observation overnight.  States that patient has 1 more seizure than patient will need transfer over to [facility] for EEG monitoring.   416 spoke to Dr. Renford Dills, hospitalist who accept the patient for observation.  The patient appears reasonably stabilized for admission considering the current resources, flow, and capabilities available in the ED at this time, and I doubt any other [facility] requiring further screening and/or treatment in the ED prior to admission.  I discussed this case with my attending physician who cosigned this note including patient's presenting symptoms, physical exam, and planned diagnostics and interventions. Attending physician stated agreement with plan or made changes to plan which were implemented.   Attending physician assessed patient at bedside.  Final Clinical Impression(s) / ED Diagnoses Final diagnoses:  Seizure [facility])    Rx / DC Orders ED Discharge Orders    None       Farrel Gordon, PA-C 10/29/20 1618    Sabas Sous, MD 10/29/20 [phone]

## 2020-10-29 NOTE — Consult Note (Signed)
Neurology Consultation Reason for Consult: Seizures Referring Physician: Eliane Decree  CC: Seizures  History is obtained from: Patient, chart review  HPI: Earl [name] is a 24 y.o. male with a history of seizures since childhood, he does not remember how old he was when his first seizure occurred as he was too young.  He was on seizure medicine until he left his mother's house, he estimates around 66.  He occasionally has seizures, the most recent one about a year ago before yesterday.  Yesterday he had multiple seizures and was given Keppra, and UDS was positive for amphetamines (the patient states it has been a month).  Today, he had recurrent seizures and was brought into the emergency department again.   He states that he does get a lightheaded type feeling and feels flushed and has some abnormal sensation in his chest just prior to his seizures.  He then awakens with people already crowding around him.  He denies tongue biting, but did have urinary incontinence.   ROS: A 14 point ROS was performed and is negative except as noted in the HPI.  Past Medical History:  Diagnosis Date  . Seizures (HCC)      Family history: Brother-heart issue   Social History:  reports that he has been smoking cigarettes. He has never used smokeless tobacco. He reports current alcohol use. He reports that he does not use drugs. Amphetamines, most recent use a month ago.  Exam: Current vital signs: BP (!) 143/89   Pulse (!) 115   Temp (!) 97.2 F (36.2 C) (Oral)   Resp 16   Ht 6\' 1"  (1.854 m)   Wt 77 kg   SpO2 99%   BMI 22.40 kg/m  Vital signs in last 24 hours: Temp:  [97.2 F (36.2 C)-98.6 F (37 C)] 97.2 F (36.2 C) (12/06 1145) Pulse Rate:  [82-115] 115 (12/06 1800) Resp:  [14-28] 16 (12/06 1800) BP: (128-155)/(89-119) 143/89 (12/06 1800) SpO2:  [95 %-100 %] 99 % (12/06 1800) Weight:  [77 kg-77.1 kg] 77 kg (12/06 1156)   Physical Exam  Constitutional: Appears well-developed and  well-nourished.  Psych: Affect appropriate to situation Eyes: No scleral injection HENT: No OP obstrucion MSK: no joint deformities.  Cardiovascular: Normal rate and regular rhythm.  Respiratory: Effort normal, non-labored breathing GI: Soft.  No distension. There is no tenderness.  Skin: WDI  Neuro: Mental Status: Patient is awake, alert, oriented to person, place, month, year, and situation. Patient is able to give a clear and coherent history. No signs of aphasia or neglect Cranial Nerves: II: Visual Fields are full. Pupils are equal, round, and reactive to light.   III,IV, VI: EOMI without ptosis or diploplia.  V: Facial sensation is symmetric to temperature VII: Facial movement is symmetric.  VIII: hearing is intact to voice X: Uvula elevates symmetrically XI: Shoulder shrug is symmetric. XII: tongue is midline without atrophy or fasciculations.  Motor: Tone is normal. Bulk is normal. 5/5 strength was present in all four extremities.  Sensory: Sensation is symmetric to light touch and temperature in the arms and legs. Cerebellar: FNF intact on the right, restricted by cuffs on the left   I have reviewed labs in epic and the results pertinent to this consultation are: CMP-unremarkable UDS-negative for amphetamines  I have reviewed the images obtained: CT head-no acute intracranial abnormalities  Impression: 24 year old male with a history of epilepsy since childhood with recurrent seizures in the setting of recent amphetamine use.  Regardless of  recent amphetamine use, he should be on antiepileptic therapy, and I would favor continuing Keppra.  Routine EEG to try and determine generalized versus focal epilepsy might be helpful.  Recommendations: 1) Keppra 500 mg twice daily 2) routine EEG   Ritta Slot, MD [facility] [phone]  If 7pm- 7am, please page neurology on call as listed in AMION.

## 2020-10-30 ENCOUNTER — Encounter (HOSPITAL_COMMUNITY): Payer: Self-pay | Admitting: Internal Medicine

## 2020-10-30 ENCOUNTER — Inpatient Hospital Stay (HOSPITAL_COMMUNITY)

## 2020-10-30 DIAGNOSIS — M25511 Pain in right shoulder: Secondary | ICD-10-CM | POA: Diagnosis present

## 2020-10-30 DIAGNOSIS — G40909 Epilepsy, unspecified, not intractable, without status epilepticus: Secondary | ICD-10-CM | POA: Diagnosis present

## 2020-10-30 DIAGNOSIS — F17210 Nicotine dependence, cigarettes, uncomplicated: Secondary | ICD-10-CM | POA: Diagnosis present

## 2020-10-30 DIAGNOSIS — Z91010 Allergy to peanuts: Secondary | ICD-10-CM | POA: Diagnosis not present

## 2020-10-30 DIAGNOSIS — Z20822 Contact with and (suspected) exposure to covid-19: Secondary | ICD-10-CM | POA: Diagnosis present

## 2020-10-30 DIAGNOSIS — R000 Tachycardia, unspecified: Secondary | ICD-10-CM | POA: Diagnosis present

## 2020-10-30 DIAGNOSIS — F1590 Other stimulant use, unspecified, uncomplicated: Secondary | ICD-10-CM | POA: Diagnosis present

## 2020-10-30 DIAGNOSIS — F1510 Other stimulant abuse, uncomplicated: Secondary | ICD-10-CM | POA: Diagnosis not present

## 2020-10-30 DIAGNOSIS — I10 Essential (primary) hypertension: Secondary | ICD-10-CM | POA: Diagnosis present

## 2020-10-30 DIAGNOSIS — E860 Dehydration: Secondary | ICD-10-CM | POA: Diagnosis present

## 2020-10-30 DIAGNOSIS — Z79899 Other long term (current) drug therapy: Secondary | ICD-10-CM | POA: Diagnosis not present

## 2020-10-30 DIAGNOSIS — D649 Anemia, unspecified: Secondary | ICD-10-CM | POA: Diagnosis present

## 2020-10-30 DIAGNOSIS — R569 Unspecified convulsions: Secondary | ICD-10-CM | POA: Diagnosis present

## 2020-10-30 LAB — CBC WITH DIFFERENTIAL/PLATELET
Abs Immature Granulocytes: 0.03 10*3/uL (ref 0.00–0.07)
Basophils Absolute: 0 10*3/uL (ref 0.0–0.1)
Basophils Relative: 0 %
Eosinophils Absolute: 0.1 10*3/uL (ref 0.0–0.5)
Eosinophils Relative: 2 %
HCT: 45.4 % (ref 39.0–52.0)
Hemoglobin: 16 g/dL (ref 13.0–17.0)
Immature Granulocytes: 0 %
Lymphocytes Relative: 37 %
Lymphs Abs: 2.5 10*3/uL (ref 0.7–4.0)
MCH: 30.4 pg (ref 26.0–34.0)
MCHC: 35.2 g/dL (ref 30.0–36.0)
MCV: 86.1 fL (ref 80.0–100.0)
Monocytes Absolute: 0.8 10*3/uL (ref 0.1–1.0)
Monocytes Relative: 12 %
Neutro Abs: 3.3 10*3/uL (ref 1.7–7.7)
Neutrophils Relative %: 49 %
Platelets: 301 10*3/uL (ref 150–400)
RBC: 5.27 MIL/uL (ref 4.22–5.81)
RDW: 12.1 % (ref 11.5–15.5)
WBC: 6.8 10*3/uL (ref 4.0–10.5)
nRBC: 0 % (ref 0.0–0.2)

## 2020-10-30 LAB — CBC
HCT: 28.2 % — ABNORMAL LOW (ref 39.0–52.0)
Hemoglobin: 9.9 g/dL — ABNORMAL LOW (ref 13.0–17.0)
MCH: 31.4 pg (ref 26.0–34.0)
MCHC: 35.1 g/dL (ref 30.0–36.0)
MCV: 89.5 fL (ref 80.0–100.0)
Platelets: 176 10*3/uL (ref 150–400)
RBC: 3.15 MIL/uL — ABNORMAL LOW (ref 4.22–5.81)
RDW: 12.3 % (ref 11.5–15.5)
WBC: 5.4 10*3/uL (ref 4.0–10.5)
nRBC: 0.6 % — ABNORMAL HIGH (ref 0.0–0.2)

## 2020-10-30 LAB — BASIC METABOLIC PANEL
Anion gap: 9 (ref 5–15)
BUN: 12 mg/dL (ref 6–20)
CO2: 22 mmol/L (ref 22–32)
Calcium: 8.5 mg/dL — ABNORMAL LOW (ref 8.9–10.3)
Chloride: 108 mmol/L (ref 98–111)
Creatinine, Ser: 1.04 mg/dL (ref 0.61–1.24)
GFR, Estimated: >60 mL/min (ref >60)
Glucose, Bld: 90 mg/dL (ref 70–99)
Potassium: 3.6 mmol/L (ref 3.5–5.1)
Sodium: 139 mmol/L (ref 135–145)

## 2020-10-30 MED ORDER — LEVETIRACETAM IN NACL 1000 MG/100ML IV SOLN
1000.0000 mg | Freq: Two times a day (BID) | INTRAVENOUS | Status: DC
  Administered 2020-10-30 – 2020-10-31 (×2): 1000 mg via INTRAVENOUS
  Filled 2020-10-30 (×2): qty 100

## 2020-10-30 MED ORDER — LEVETIRACETAM IN NACL 500 MG/100ML IV SOLN
500.0000 mg | Freq: Once | INTRAVENOUS | Status: AC
  Administered 2020-10-30: 500 mg via INTRAVENOUS
  Filled 2020-10-30: qty 100

## 2020-10-30 NOTE — ED Notes (Signed)
Attempted report x1, per receiving unit, room not ready yet.

## 2020-10-30 NOTE — ED Notes (Signed)
Pt provided breakfast tray.

## 2020-10-30 NOTE — Progress Notes (Signed)
EEG complete - results pending 

## 2020-10-30 NOTE — ED Notes (Signed)
Pt asleep, 2 sheriff's officers at bedside.

## 2020-10-30 NOTE — Procedures (Signed)
Patient Name: Earl [name]  MRN: [id number]  Epilepsy Attending: Charlsie Quest  Referring Physician/Provider: Dr Burnadette Pop Date: 10/30/2020 Duration: 22.45 mins  Patient history: 24 year old male with a history of seizures with the sudden abrupt increase in seizure frequency despite addition of Keppra. EEG to evaluate for seizure  Level of alertness: Awake  AEDs during EEG study: LEV  Technical aspects: This EEG study was done with scalp electrodes positioned according to the 10-20 International system of electrode placement. Electrical activity was acquired at a sampling rate of 500Hz  and reviewed with a high frequency filter of 70Hz  and a low frequency filter of 1Hz . EEG data were recorded continuously and digitally stored.   Description: The posterior dominant rhythm consists of 10 Hz activity of moderate voltage (25-35 uV) seen predominantly in posterior head regions, symmetric and reactive to eye opening and eye closing.  Hyperventilation and photic stimulation were not performed.     IMPRESSION: This study is within normal limits. No seizures or epileptiform discharges were seen throughout the recording.  Eowyn Tabone 

## 2020-10-30 NOTE — Progress Notes (Signed)
vLTM setup following baseline EEG   Same leads  Neurology notified  Event button tested   [facility] to monitor

## 2020-10-30 NOTE — ED Notes (Signed)
[facility] notified of need for transport. °

## 2020-10-30 NOTE — Progress Notes (Signed)
Subjective:  again had seizure last night  Exam: Vitals:   10/30/20 1015 10/30/20 1101  BP: (!) 142/94 (!) 157/97  Pulse: 100 (!) 104  Resp: 19 18  Temp: 99.1 F (37.3 C) 98.5 F (36.9 C)  SpO2: 100% 99%   Gen: In bed, NAD Resp: non-labored breathing, no acute distress Abd: soft, nt  Neuro: MS: awake, alert, interactive and appropriate CN: EOMI, face symmetric Motor: Moves all extremities well Sensory: Intact to light touch  Pertinent Labs: BMP- unremarkable other than borderline calcium (albumin also low)  Impression: 24 year old male with a history of seizures with the sudden abrupt increase in seizure frequency despite addition of Keppra.  It is possible that his amphetamine use is responsible for his sudden increase in seizure frequency.  Given his report that it has been quite sometime since use, I also wonder about secondary gain from nonepileptic seizures, though at this point I do not have a strong suspicion for this.  Given lack of response to treatment, however, I have asked him to be transferred from [facility] to [facility] for continuous EEG monitoring to further characterize the spells.  Recommendations: 1) continuous EEG 2) Keppra increased to 1 g twice daily  Ritta Slot, MD [facility] [phone]  If 7pm- 7am, please page neurology on call as listed in AMION.

## 2020-10-30 NOTE — Progress Notes (Addendum)
PROGRESS NOTE    Earl [name]  ZOX:[id number] DOB: 01/01/1996 DOA: 10/29/2020 PCP: Patient, No Pcp Per   Chief Complain:Seizure   Brief Narrative: Earl [name] is a 24 y.o. male with medical history significant of seizure disorder but not taking any antiseizure medications who was brought from jail( currently in probation) with 3 witnessed  events of tonic clonic seizures.  Patient actually presented here with same complaint on 10/28/2020, he was loaded with Keppra in the emergency department and was discharged.  After discharge, there was report that he had 3 more seizure episodes in the jail.  Patient has history of seizure disorder since childhood but was not taking his medication and he cannot remember the name of the medication he was taking before.  UDS done in the emergency department on 10/28/2020 showed amphetamines.  As per the report, patient had another seizure episode on the evening of 10/29/2020.  Neurology recommended to transfer the patient to [facility] for  LTM EEG/video EEG.    Assessment & Plan:   Active Problems:   Seizure (HCC)   Seizures: History of seizure disorder but currently not taking medications.  Had 2  seizure episodes on 10/28/20 and  and 3 on 10/29/20.had another seizure in the evening of 10/29/2020 but without any clear report from the nursing staff.  We will continue Keppra IV.  Seizure precautions.  IV Ativan for breakthrough seizures. Imagings done in the emergency department did not show any acute abnormality. Neurology following and recommended to [facility].  Neurology will continue to follow.  Sinus tachycardia:Cud be from dehydration,decreased oral intake. Continue gentle IV fluids  Substance abuse: Amphetamines positive in the UDS on 10/28/2020.   Hypertension: Most likely secondary to postictal state/stress/anxiety.Continue to monitor.Continue PRN meds.    Normocytic anemia: Hemoglobin in the range of 9 today.  Yesterday it was  showing 15.  I suspect that this is an error.  We will repeat CBC.  No suspicion for acute blood loss.          DVT prophylaxis:Lovenox Code Status: Full Family Communication: None at bedside Status is: Observation  The patient remains OBS appropriate and will d/c before 2 midnights.  Dispo: The patient is from: jail              Anticipated d/c is to: jail              Anticipated d/c date is: 2days               Patient currently is not medically stable to d/c.     Consultants: Neurology  Procedures: None  Antimicrobials:  Anti-infectives (From admission, onward)   None      Subjective: Patient seen and examined at the bedside in the emergency department.  Overall hemodynamically stable during my evaluation, sleeping when I entered the room.  Police were at the bedside.  Woke up when I called his name  and denies any complaints.  Alert and oriented  Objective: Vitals:   10/30/20 0815 10/30/20 0915 10/30/20 1015 10/30/20 1101  BP: (!) 138/97 126/88 (!) 142/94 (!) 157/97  Pulse: 92 (!) 107 100 (!) 104  Resp: (!) 21 (!) 24 19 18   Temp:   99.1 F (37.3 C) 98.5 F (36.9 C)  TempSrc:    Oral  SpO2: 97% 99% 100% 99%  Weight:      Height:        Intake/Output Summary (Last 24 hours) at 10/30/2020 1133  Last data filed at 10/30/2020 9735 Gross per 24 hour  Intake 100 ml  Output --  Net 100 ml   Filed Weights   10/29/20 1156  Weight: 77 kg    Examination:  General exam: Appears calm and comfortable ,Not in distress HEENT:PERRL,Oral mucosa moist, Ear/Nose normal on gross exam Respiratory system: Bilateral equal air entry, normal vesicular breath sounds, no wheezes or crackles  Cardiovascular system: S1 & S2 heard, RRR. No JVD, murmurs, rubs, gallops or clicks. No pedal edema. Gastrointestinal system: Abdomen is nondistended, soft and nontender. No organomegaly or masses felt. Normal bowel sounds heard. Central nervous system: Alert and oriented. No focal  neurological deficits. Extremities: No edema, no clubbing ,no cyanosis, distal peripheral pulses palpable. Skin: Tatoos,No rashes, lesions or ulcers,no icterus ,no pallor   Data Reviewed: I have personally reviewed following labs and imaging studies  CBC: Recent Labs  Lab 10/28/20 2019 10/29/20 1515 10/30/20 0500  WBC 7.3 7.9 5.4  HGB 11.9* 15.6 9.9*  HCT 34.3* 44.9 28.2*  MCV 91.0 89.1 89.5  PLT 214 308 176   Basic Metabolic Panel: Recent Labs  Lab 10/28/20 2019 10/29/20 1300 10/30/20 0500  NA 143 141 139  K 3.0* 3.7 3.6  CL 114* 106 108  CO2 21* 25 22  GLUCOSE 72 100* 90  BUN 9 10 12   CREATININE 0.81 1.13 1.04  CALCIUM 7.1* 8.9 8.5*   GFR: Estimated Creatinine Clearance: 117.2 mL/min (by C-G formula based on SCr of 1.04 mg/dL). Liver Function Tests: Recent Labs  Lab 10/28/20 2019 10/29/20 1300  AST 19 26  ALT 47* 55*  ALKPHOS 40 45  BILITOT 0.7 1.1  PROT 5.7* 7.1  ALBUMIN 3.3* 4.3   No results for input(s): LIPASE, AMYLASE in the last 168 hours. No results for input(s): AMMONIA in the last 168 hours. Coagulation Profile: No results for input(s): INR, PROTIME in the last 168 hours. Cardiac Enzymes: No results for input(s): CKTOTAL, CKMB, CKMBINDEX, TROPONINI in the last 168 hours. BNP (last 3 results) No results for input(s): PROBNP in the last 8760 hours. HbA1C: No results for input(s): HGBA1C in the last 72 hours. CBG: No results for input(s): GLUCAP in the last 168 hours. Lipid Profile: No results for input(s): CHOL, HDL, LDLCALC, TRIG, CHOLHDL, LDLDIRECT in the last 72 hours. Thyroid Function Tests: No results for input(s): TSH, T4TOTAL, FREET4, T3FREE, THYROIDAB in the last 72 hours. Anemia Panel: No results for input(s): VITAMINB12, FOLATE, FERRITIN, TIBC, IRON, RETICCTPCT in the last 72 hours. Sepsis Labs: No results for input(s): PROCALCITON, LATICACIDVEN in the last 168 hours.  Recent Results (from the past 240 hour(s))  Resp Panel by  RT-PCR (Flu A&B, Covid) Nasopharyngeal Swab     Status: None   Collection Time: 10/29/20  4:08 PM   Specimen: Nasopharyngeal Swab; Nasopharyngeal(NP) swabs in vial transport medium  Result Value Ref Range Status   SARS Coronavirus 2 by RT PCR NEGATIVE NEGATIVE Final    Comment: (NOTE) SARS-CoV-2 target nucleic acids are NOT DETECTED.  The SARS-CoV-2 RNA is generally detectable in upper respiratory specimens during the acute phase of infection. The lowest concentration of SARS-CoV-2 viral copies this assay can detect is 138 copies/mL. A negative result does not preclude SARS-Cov-2 infection and should not be used as the sole basis for treatment or other patient management decisions. A negative result may occur with  improper specimen collection/handling, submission of specimen other than nasopharyngeal swab, presence of viral mutation(s) within the areas targeted by this assay, and  inadequate number of viral copies(<138 copies/mL). A negative result must be combined with clinical observations, patient history, and epidemiological information. The expected result is Negative.  Fact Sheet for Patients:  BloggerCourse.com  Fact Sheet for Healthcare Providers:  SeriousBroker.it  This test is no t yet approved or cleared by the [place] FDA and  has been authorized for detection and/or diagnosis of SARS-CoV-2 by FDA under an Emergency Use Authorization (EUA). This EUA will remain  in effect (meaning this test can be used) for the duration of the COVID-19 declaration under Section 564(b)(1) of the Act, 21 U.S.C.section 360bbb-3(b)(1), unless the authorization is terminated  or revoked sooner.       Influenza A by PCR NEGATIVE NEGATIVE Final   Influenza B by PCR NEGATIVE NEGATIVE Final    Comment: (NOTE) The Xpert Xpress SARS-CoV-2/FLU/RSV plus assay is intended as an aid in the diagnosis of influenza from Nasopharyngeal swab  specimens and should not be used as a sole basis for treatment. Nasal washings and aspirates are unacceptable for Xpert Xpress SARS-CoV-2/FLU/RSV testing.  Fact Sheet for Patients: BloggerCourse.com  Fact Sheet for Healthcare Providers: SeriousBroker.it  This test is not yet approved or cleared by the [place] FDA and has been authorized for detection and/or diagnosis of SARS-CoV-2 by FDA under an Emergency Use Authorization (EUA). This EUA will remain in effect (meaning this test can be used) for the duration of the COVID-19 declaration under Section 564(b)(1) of the Act, 21 U.S.C. section 360bbb-3(b)(1), unless the authorization is terminated or revoked.  Performed at [facility], [address] [place], [place] [address]          Radiology Studies: DG Lumbar Spine Complete  Result Date: 10/29/2020 CLINICAL DATA:  Seizures, fall EXAM: LUMBAR SPINE - COMPLETE 4+ VIEW COMPARISON:  None. FINDINGS: Lumbar vertebral body heights and alignment are preserved. There is no acute fracture. There is no evidence of pars break. Intervertebral disc spaces are preserved. There is no significant facet hypertrophy. Sacroiliac joints are unremarkable. IMPRESSION: No acute or significant abnormality. Electronically Signed   By: Guadlupe Spanish M.D.   On: 10/29/2020 13:22   DG Shoulder Right  Result Date: 10/28/2020 CLINICAL DATA:  Seizure today. Fell from bed onto a hard floor. Right shoulder pain. EXAM: RIGHT SHOULDER - 2+ VIEW COMPARISON:  None. FINDINGS: There is no evidence of fracture or dislocation. There is no evidence of arthropathy or other focal bone abnormality. Soft tissues are unremarkable. IMPRESSION: Negative. Electronically Signed   By: Amie Portland M.D.   On: 10/28/2020 20:44   CT Head Wo Contrast  Result Date: 10/29/2020 CLINICAL DATA:  Three witnessed seizures about 30 seconds each occurring 5-7  minutes apart, was post ictal following seizures but now alert and oriented, single episode urinary incontinence EXAM: CT HEAD WITHOUT CONTRAST CT CERVICAL SPINE WITHOUT CONTRAST TECHNIQUE: Multidetector CT imaging of the head and cervical spine was performed following the standard protocol without intravenous contrast. Multiplanar CT image reconstructions of the cervical spine were also generated. COMPARISON:  10/28/2020 CT head FINDINGS: CT HEAD FINDINGS Brain: Normal ventricular morphology. No midline shift or mass effect. Normal appearance of brain parenchyma. No intracranial hemorrhage, mass lesion, evidence of acute infarction, or extra-axial fluid collection. Vascular: No hyperdense vessels Skull: Intact Sinuses/Orbits: Opacified RIGHT frontal sinus and LEFT sphenoid sinus unchanged. Other: N/A CT CERVICAL SPINE FINDINGS Alignment: Normal Skull base and vertebrae: Osseous mineralization normal. Skull base intact. Vertebral body and disc space heights maintained. Facet alignments normal.  No fracture, subluxation, or bone destruction. Soft tissues and spinal canal: Prevertebral soft tissues normal thickness. Large calcification LEFT submandibular gland question sialolith. Disc levels:  No specific abnormalities Upper chest: Lung apices clear Other: N/A IMPRESSION: No acute intracranial abnormalities. 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 No acute cervical spine abnormalities. Large calcification in LEFT submandibular gland question sialolith. Electronically Signed   By: Ulyses Southward M.D.   On: 10/29/2020 15:09   CT HEAD WO CONTRAST  Result Date: 10/28/2020 CLINICAL DATA:  Unwitnessed seizure, history of seizures, most recently 1 year prior EXAM: CT HEAD WITHOUT CONTRAST TECHNIQUE: Contiguous axial images were obtained from the base of the skull through the vertex without intravenous contrast. COMPARISON:  Maxillofacial CT 11/19/2007 FINDINGS: Brain: No evidence of acute infarction,  hemorrhage, hydrocephalus, extra-axial collection, visible mass lesion or mass effect. Vascular: No hyperdense vessel or unexpected calcification. Skull: No calvarial fracture or suspicious osseous lesion. No scalp swelling or hematoma. Sinuses/Orbits: Mural thickening in the paranasal sinuses most pronounced in the right ethmoids, frontal sinus and left sphenoid sinus and lateral recess. No layering air-fluid levels. Mastoid air cells are predominantly clear. Middle ear cavities are clear. Included orbital structures are unremarkable. Other: None IMPRESSION: 1. No acute intracranial findings. 2. Pansinus disease. Electronically Signed   By: Kreg Shropshire M.D.   On: 10/28/2020 20:51   CT Cervical Spine Wo Contrast  Result Date: 10/29/2020 CLINICAL DATA:  Three witnessed seizures about 30 seconds each occurring 5-7 minutes apart, was post ictal following seizures but now alert and oriented, single episode urinary incontinence EXAM: CT HEAD WITHOUT CONTRAST CT CERVICAL SPINE WITHOUT CONTRAST TECHNIQUE: Multidetector CT imaging of the head and cervical spine was performed following the standard protocol without intravenous contrast. Multiplanar CT image reconstructions of the cervical spine were also generated. COMPARISON:  10/28/2020 CT head FINDINGS: CT HEAD FINDINGS Brain: Normal ventricular morphology. No midline shift or mass effect. Normal appearance of brain parenchyma. No intracranial hemorrhage, mass lesion, evidence of acute infarction, or extra-axial fluid collection. Vascular: No hyperdense vessels Skull: Intact Sinuses/Orbits: Opacified RIGHT frontal sinus and LEFT sphenoid sinus unchanged. Other: N/A CT CERVICAL SPINE FINDINGS Alignment: Normal Skull base and vertebrae: Osseous mineralization normal. Skull base intact. Vertebral body and disc space heights maintained. Facet alignments normal. No fracture, subluxation, or bone destruction. Soft tissues and spinal canal: Prevertebral soft tissues normal  thickness. Large calcification LEFT submandibular gland question sialolith. Disc levels:  No specific abnormalities Upper chest: Lung apices clear Other: N/A IMPRESSION: No acute intracranial abnormalities. 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 No acute cervical spine abnormalities. Large calcification in LEFT submandibular gland question sialolith. Electronically Signed   By: Ulyses Southward M.D.   On: 10/29/2020 15:09        Scheduled Meds: . enoxaparin (LOVENOX) injection  40 mg Subcutaneous Q24H   Continuous Infusions: . sodium chloride 100 mL/hr at 10/30/20 0851  . levETIRAcetam       LOS: 0 days    Time spent: 35 mins.More than 50% of that time was spent in counseling and/or coordination of care.      Burnadette Pop, MD [facility] P12/05/2020, 11:33 AM

## 2020-10-31 ENCOUNTER — Emergency Department (HOSPITAL_COMMUNITY)
Admission: EM | Admit: 2020-10-31 | Discharge: 2020-10-31 | Disposition: A | Discharge status: Home / Self Care | Attending: Emergency Medicine | Admitting: Emergency Medicine

## 2020-10-31 ENCOUNTER — Encounter (HOSPITAL_COMMUNITY): Payer: Self-pay | Admitting: Emergency Medicine

## 2020-10-31 ENCOUNTER — Other Ambulatory Visit: Payer: Self-pay

## 2020-10-31 DIAGNOSIS — Z91010 Allergy to peanuts: Secondary | ICD-10-CM | POA: Insufficient documentation

## 2020-10-31 DIAGNOSIS — R569 Unspecified convulsions: Secondary | ICD-10-CM

## 2020-10-31 DIAGNOSIS — F17210 Nicotine dependence, cigarettes, uncomplicated: Secondary | ICD-10-CM | POA: Insufficient documentation

## 2020-10-31 DIAGNOSIS — F1510 Other stimulant abuse, uncomplicated: Secondary | ICD-10-CM

## 2020-10-31 LAB — CBC WITH DIFFERENTIAL/PLATELET
Abs Immature Granulocytes: 0.02 10*3/uL (ref 0.00–0.07)
Basophils Absolute: 0 10*3/uL (ref 0.0–0.1)
Basophils Relative: 1 %
Eosinophils Absolute: 0.2 10*3/uL (ref 0.0–0.5)
Eosinophils Relative: 3 %
HCT: 42.5 % (ref 39.0–52.0)
Hemoglobin: 15.4 g/dL (ref 13.0–17.0)
Immature Granulocytes: 0 %
Lymphocytes Relative: 40 %
Lymphs Abs: 3.2 10*3/uL (ref 0.7–4.0)
MCH: 31.4 pg (ref 26.0–34.0)
MCHC: 36.2 g/dL — ABNORMAL HIGH (ref 30.0–36.0)
MCV: 86.7 fL (ref 80.0–100.0)
Monocytes Absolute: 0.9 10*3/uL (ref 0.1–1.0)
Monocytes Relative: 12 %
Neutro Abs: 3.7 10*3/uL (ref 1.7–7.7)
Neutrophils Relative %: 44 %
Platelets: 310 10*3/uL (ref 150–400)
RBC: 4.9 MIL/uL (ref 4.22–5.81)
RDW: 12.2 % (ref 11.5–15.5)
WBC: 8.1 10*3/uL (ref 4.0–10.5)
nRBC: 0 % (ref 0.0–0.2)

## 2020-10-31 MED ORDER — LEVETIRACETAM 1000 MG PO TABS: 1000.0000 mg | ORAL_TABLET | Freq: Two times a day (BID) | ORAL | 2 refills | Status: DC

## 2020-10-31 MED ORDER — KETOROLAC TROMETHAMINE 30 MG/ML IJ SOLN
30.0000 mg | Freq: Once | INTRAMUSCULAR | Status: AC
  Administered 2020-10-31: 30 mg via INTRAVENOUS
  Filled 2020-10-31 (×2): qty 1

## 2020-10-31 MED ORDER — LEVETIRACETAM IN NACL 1000 MG/100ML IV SOLN
1000.0000 mg | Freq: Once | INTRAVENOUS | Status: AC
  Administered 2020-10-31: 1000 mg via INTRAVENOUS
  Filled 2020-10-31: qty 100

## 2020-10-31 MED ORDER — LEVETIRACETAM 750 MG PO TABS: 1500.0000 mg | ORAL_TABLET | Freq: Two times a day (BID) | ORAL | 0 refills | Status: DC

## 2020-10-31 MED ORDER — DIPHENHYDRAMINE HCL 25 MG PO CAPS
50.0000 mg | ORAL_CAPSULE | Freq: Every evening | ORAL | Status: DC | PRN
  Administered 2020-10-31: 50 mg via ORAL
  Filled 2020-10-31: qty 2

## 2020-10-31 NOTE — Progress Notes (Signed)
vLTM EEG complete. No skin breakdown 

## 2020-10-31 NOTE — Hospital Course (Signed)
24 year old man PMH seizure disorder, not on seizure medications for several years.  Presented with multiple seizures in the context of amphetamine use, recently started on Keppra.  Additional seizure activity was noted and he was transferred to [facility] for video EEG.  EEG was unremarkable, he responded well to Keppra, and was seen again by neurology and cleared for discharge on increased Keppra.  Hospitalization uncomplicated.

## 2020-10-31 NOTE — TOC Transition Note (Signed)
Transition of Care [facility]) - CM/SW Discharge Note   Patient Details  Name: Earl [name] MRN: [id number] Date of Birth: [birth year]  Transition of [facility] CM/SW Contact:  Kermit Balo, RN Phone Number: 10/31/2020, 12:16 PM   Clinical Narrative:    [facility] correction facility [facility]. Guards to arrange and provide transport.  No needs per TOC.   Final next level of care: Corrections Facility Barriers to Discharge: No Barriers Identified   Patient Goals and CMS Choice        Discharge Placement                       Discharge Plan and Services                                     Social Determinants of Health (SDOH) Interventions     Readmission Risk Interventions No flowsheet data found.

## 2020-10-31 NOTE — Discharge Instructions (Signed)
Follow-up with neurology either through our neurologist or another neurologist.

## 2020-10-31 NOTE — Progress Notes (Signed)
Subjective: No further seziure  Exam: Vitals:   10/31/20 0312 10/31/20 0806  BP: (!) 138/100 (!) 149/91  Pulse: 87 89  Resp: 18 18  Temp: 98.1 F (36.7 C) 98.7 F (37.1 C)  SpO2: 96% 98%   Gen: In bed, NAD Resp: non-labored breathing, no acute distress  Neuro: MS: awake, alert, interactive and appropriate CN: EOMI, face symmetric Motor: Moves all extremities well Sensory: Intact to light touch  Pertinent Labs: BMP- unremarkable other than borderline calcium (albumin also low)  Impression: 24 year old male with a history of seizures with the sudden abrupt increase in seizure frequency despite addition of Keppra.  It is possible that his amphetamine use is responsible for his sudden increase in seizure frequency, but now seizure free x more than 24 hours on higher dose of keppra.   Recommendations: 1) keppra 1 g BID 2) Ok to d/[facility] back to jail, would continue EEG until ready to discharge.   Ritta Slot, MD [facility] [phone]  If 7pm- 7am, please page neurology on call as listed in AMION.

## 2020-10-31 NOTE — Discharge Instructions (Signed)
Seizure, Adult A seizure is a sudden burst of abnormal electrical activity in the brain. Seizures usually last from 30 seconds to 2 minutes. The abnormal activity temporarily interrupts normal brain function. A seizure can cause many different symptoms depending on where in the brain it starts. What are the causes? Common causes of this condition include:  Fever or infection.  Brain abnormality, injury, bleeding, or tumor.  Low blood sugar.  Metabolic disorders or other conditions that are passed from parent to child (are inherited).  Reaction to a substance, such as a drug or a medicine, or suddenly stopping the use of a substance (withdrawal).  Stroke.  Developmental disorders such as autism or cerebral palsy. In some cases, the cause of this condition may not be known. Some people who have a seizure never have another one. Seizures usually do not cause brain damage or permanent problems unless they are prolonged. A person who has repeated seizures over time without a clear cause has a condition called epilepsy. What increases the risk? You are more likely to develop this condition if you have:  A family history of epilepsy.  Had a tonic-clonic seizure in the past. This is a type of seizure that involves whole-body contraction of muscles and a loss of consciousness.  Autism, cerebral palsy, or other brain disorders.  A history of head trauma, lack of oxygen at birth, or strokes. What are the signs or symptoms? There are many different types of seizures. The symptoms of a seizure vary depending on the type of seizure you have. Examples of symptoms during a seizure include:  Uncontrollable shaking (convulsions).  Stiffening of the body.  Loss of consciousness.  Head nodding.  Staring.  Not responding to sound or touch.  Loss of bladder or bowel control. Some people have symptoms right before a seizure happens (aura) and right after a seizure happens (postictal). Symptoms  before a seizure may include:  Fear or anxiety.  Nausea.  Feeling like the room is spinning (vertigo).  A feeling of having seen or heard something before (dj vu).  Odd tastes or smells.  Changes in vision, such as seeing flashing lights or spots. Symptoms after a seizure may include:  Confusion.  Sleepiness.  Headache.  Weakness on one side of the body. How is this diagnosed? This condition may be diagnosed based on:  A description of your symptoms. Video of your seizures can be helpful.  Your medical history.  A physical exam. You may also have tests, including:  Blood tests.  CT scan.  MRI.  Electroencephalogram (EEG). This test measures electrical activity in the brain. An EEG can predict whether seizures will return (recur).  A spinal tap (also called a lumbar puncture). This is the removal and testing of fluid that surrounds the brain and spinal cord. How is this treated? Most seizures will stop on their own in under 5 minutes, and no treatment is needed. Seizures that last longer than 5 minutes will usually need treatment. Treatment can include:  Medicines given through an IV.  Avoiding known triggers, such as medicines that you take for another condition.  Medicines to treat epilepsy (antiepileptics), if epilepsy caused your seizures.  Surgery to stop seizures, if you have epilepsy that does not respond to medicines. Follow these instructions at home: Medicines  Take over-the-counter and prescription medicines only as told by your health care provider.  Avoid any substances that may prevent your medicine from working properly, such as alcohol. Activity  Do not drive,   swim, or do any other activities that would be dangerous if you had another seizure. Wait until your health care provider says it is safe to do them.  If you live in the [place] check with your local DMV (department of motor vehicles) to find out about local driving laws. Each state  has specific rules about when you can legally return to driving.  Get enough rest. Lack of sleep can make seizures more likely to occur. Educating others Teach friends and family what to do if you have a seizure. They should:  Lay you on the ground to prevent a fall.  Cushion your head and body.  Loosen any tight clothing around your neck.  Turn you on your side. If vomiting occurs, this helps keep your airway clear.  Not hold you down. Holding you down will not stop the seizure.  Not put anything into your mouth.  Know whether or not you need emergency care. For example, they should get help right away if you have a seizure that lasts longer than 5 minutes or have several seizures in a row.  Stay with you until you recover.  General instructions  Contact your health care provider each time you have a seizure.  Avoid anything that has ever triggered a seizure for you.  Keep a seizure diary. Record what you remember about each seizure, especially anything that might have triggered the seizure.  Keep all follow-up visits as told by your health care provider. This is important. Contact a health care provider if:  You have another seizure.  You have seizures more often.  Your seizure symptoms change.  You continue to have seizures with treatment.  You have symptoms of an infection or illness. This might increase your risk of having a seizure. Get help right away if:  You have a seizure that: ? Lasts longer than 5 minutes. ? Is different than previous seizures. ? Leaves you unable to speak or use a part of your body. ? Makes it harder to breathe.  You have: ? A seizure after a head injury. ? Multiple seizures in a row. ? Confusion or a severe headache right after a seizure.  You do not wake up immediately after a seizure.  You injure yourself during a seizure. These symptoms may represent a serious problem that is an emergency. Do not wait to see if the symptoms  will go away. Get medical help right away. Call your local emergency services (911 in the [place] Do not drive yourself to the hospital. Summary  Seizures are caused by abnormal electrical activity in the brain. The activity disrupts normal brain function and can cause various symptoms, such as convulsions, abnormal movements, or a change in consciousness.  There are many causes of seizures, including illnesses, medicines, genetic conditions, head injuries, strokes, tumors, substance abuse, or substance withdrawal.  Most seizures will stop on their own in under 5 minutes. Seizures that last longer than 5 minutes are a medical emergency and require immediate treatment.  Many medicines are used to treat seizures. Take over-the-counter and prescription medicines only as told by your health care provider. This information is not intended to replace advice given to you by your health care provider. Make sure you discuss any questions you have with your health care provider. Document Revised: 01/28/2019 Document Reviewed: 01/28/2019 Elsevier Patient Education  2020 [employer]  

## 2020-10-31 NOTE — ED Notes (Signed)
Pt reports taking seizure medications at 1600 today once he returned to the [place].

## 2020-10-31 NOTE — Discharge Summary (Addendum)
Physician Discharge Summary  Earl [name] [facility] OIN:[id number] DOB: November 18, 1996 DOA: 10/29/2020  PCP: Patient, No Pcp Per  Admit date: 10/29/2020 Discharge date: 10/31/2020  Recommendations for Outpatient Follow-up:  1. Ambulatory referral to neurology 4 weeks for follow-up seizure disorder 2. Ambulatory referral to PCP upon release from jail recommended.  Follow-up elevated ALT, consider repeat CMP 3. Continue to encourage abstinence from illegal drugs  Per [facility] statutes, patients with seizures are not allowed to drive until they have been seizure-free for six months.  Use caution when using heavy equipment or power tools. Avoid working on ladders or at heights. Take showers instead of baths. Ensure the water temperature is not too high on the home water heater. Do not go swimming alone. Do not lock yourself in a room alone (i.e. bathroom). When caring for infants or small children, sit down when holding, feeding, or changing them to minimize risk of injury to the child in the event you have a seizure. Maintain good sleep hygiene. Avoid alcohol.  If patient has another seizure, call 911 and bring them back to the ED if: A.  The seizure lasts longer than 5 minutes.      B.  The patient doesn't wake shortly after the seizure or has new problems such as difficulty seeing, speaking or moving following the seizure C.  The patient was injured during the seizure D.  The patient has a temperature over 102 F (39C) E.  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 Yes; Fluid  consistency: Thin  Diet effective now       Comments: Please send house diet with every meal  Question Answer Comment  Room service appropriate? Yes   Fluid consistency: Thin      10/30/20 1410           Filed Weights   10/29/20 1156  Weight: 77 kg    HPI/Hospital Course:   24 year old man PMH seizure disorder, not on seizure medications for several years.  Presented with multiple seizures in the context of amphetamine use, recently started on Keppra.  Additional seizure activity was noted and he was transferred to [facility] for video EEG.  EEG was unremarkable, he responded well to Keppra, and was seen again by neurology and cleared for discharge on increased Keppra.  Hospitalization uncomplicated.  Seizure disorder with recurrent seizures, now under control.   --Continue Keppra 1 g twice daily, recommend follow-up with neurology in 4 weeks. --Standard seizure precautions  Amphetamine use --Recommended cessation.  Discussed drugs can worsen seizures.   Consults:  . Neurology    Today's assessment: S: CC: f/u seizure Feels fine no complaints, no issues per nursing  O: Vitals:  Vitals:   10/31/20 0312 10/31/20 0806  BP: (!) 138/100 (!) 149/91  Pulse: 87 89  Resp: 18 18  Temp: 98.1 F (36.7 C) 98.7 F (37.1 C)  SpO2: 96% 98%    Constitutional:  . Appears calm and comfortable ENMT:  . grossly normal hearing  Respiratory:  . CTA bilaterally,  no w/r/r.  . Respiratory effort normal.  Cardiovascular:  . RRR, no m/r/g . No LE extremity edema   Musculoskeletal:  . RUE, LUE, RLE, LLE   . Appear grossly norml Psychiatric:  . Mental status o Mood, affect appropriate  Discharge Instructions  Discharge Instructions    Ambulatory referral to Neurology   Complete by: As directed    An appointment is requested in approximately: 4 weeks follow-up seizure disorder   Diet general   Complete by: As directed    Discharge instructions   Complete by: As directed     Call your physician or seek immediate medical attention for seizures, confusion, passing out or worsening of condition.  Per [facility] statutes, patients with seizures are not allowed to drive until they have been seizure-free for six months.  Use caution when using heavy equipment or power tools. Avoid working on ladders or at heights. Take showers instead of baths. Ensure the water temperature is not too high on the home water heater. Do not go swimming alone. Do not lock yourself in a room alone (i.e. bathroom). When caring for infants or small children, sit down when holding, feeding, or changing them to minimize risk of injury to the child in the event you have a seizure. Maintain good sleep hygiene. Avoid alcohol.  If patienthas another seizure, call 911 and bring them back to the ED if: A. The seizure lasts longer than 5 minutes.  B. The patient doesn't wake shortly after the seizure or has new problems such as difficulty seeing, speaking or moving following the seizure C. The patient was injured during the seizure D. The patient has a temperature over 102 F (39C) E. The patient vomited during the seizure and now is having trouble breathing   Increase activity slowly   Complete by: As directed      Allergies as of 10/31/2020      Reactions   Peanut-containing Drug Products Anaphylaxis   hives      Medication List    STOP taking these medications   Meclizine HCl 25 MG Chew   UNKNOWN TO PATIENT     TAKE these medications   acetaminophen 325 MG tablet Commonly known as: TYLENOL Take 650 mg by mouth 3 (three) times daily as needed for moderate pain.   levETIRAcetam 1000 MG tablet Commonly known as: Keppra Take 1 tablet (1,000 mg total) by mouth 2 (two) times daily. What changed:   medication strength  how much to take   loperamide 2 MG tablet Commonly known as: IMODIUM A-D Take 2 mg by mouth 2 (two) times daily as needed for diarrhea or loose stools.       Allergies  Allergen Reactions  . Peanut-Containing Drug Products Anaphylaxis    hives    The results of significant diagnostics from this hospitalization (including imaging, microbiology, ancillary and laboratory) are listed below for reference.    Significant Diagnostic Studies: DG Lumbar Spine Complete  Result Date: 10/29/2020 CLINICAL DATA:  Seizures, fall EXAM: LUMBAR SPINE - COMPLETE 4+ VIEW COMPARISON:  None. FINDINGS: Lumbar vertebral body heights and alignment are preserved. There is no acute fracture. There is no evidence of pars break. Intervertebral disc spaces are preserved. There is no significant facet hypertrophy. Sacroiliac joints are unremarkable. IMPRESSION: No acute or significant abnormality. Electronically Signed   By: Guadlupe Spanish M.D.   On: 10/29/2020 13:22   DG Shoulder Right  Result Date: 10/28/2020 CLINICAL DATA:  Seizure today. Fell from bed  onto a hard floor. Right shoulder pain. EXAM: RIGHT SHOULDER - 2+ VIEW COMPARISON:  None. FINDINGS: There is no evidence of fracture or dislocation. There is no evidence of arthropathy or other focal bone abnormality. Soft tissues are unremarkable. IMPRESSION: Negative. Electronically Signed   By: Amie Portlandavid  Ormond M.D.   On: 10/28/2020 20:44   CT Head Wo Contrast  Result Date: 10/29/2020 CLINICAL DATA:  Three witnessed seizures about 30 seconds each occurring 5-7 minutes apart, was post ictal following seizures but now alert and oriented, single episode urinary incontinence EXAM: CT HEAD WITHOUT CONTRAST CT CERVICAL SPINE WITHOUT CONTRAST TECHNIQUE: Multidetector CT imaging of the head and cervical spine was performed following the standard protocol without intravenous contrast. Multiplanar CT image reconstructions of the cervical spine were also generated. COMPARISON:  10/28/2020 CT head FINDINGS: CT HEAD FINDINGS Brain: Normal ventricular morphology. No midline shift or mass effect. Normal appearance of brain parenchyma. No  intracranial hemorrhage, mass lesion, evidence of acute infarction, or extra-axial fluid collection. Vascular: No hyperdense vessels Skull: Intact Sinuses/Orbits: Opacified RIGHT frontal sinus and LEFT sphenoid sinus unchanged. Other: N/A CT CERVICAL SPINE FINDINGS Alignment: Normal Skull base and vertebrae: Osseous mineralization normal. Skull base intact. Vertebral body and disc space heights maintained. Facet alignments normal. No fracture, subluxation, or bone destruction. Soft tissues and spinal canal: Prevertebral soft tissues normal thickness. Large calcification LEFT submandibular gland question sialolith. Disc levels:  No specific abnormalities Upper chest: Lung apices clear Other: N/A IMPRESSION: No acute intracranial abnormalities. 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 No acute cervical spine abnormalities. Large calcification in LEFT submandibular gland question sialolith. Electronically Signed   By: Ulyses SouthwardMark  Boles M.D.   On: 10/29/2020 15:09   CT HEAD WO CONTRAST  Result Date: 10/28/2020 CLINICAL DATA:  Unwitnessed seizure, history of seizures, most recently 1 year prior EXAM: CT HEAD WITHOUT CONTRAST TECHNIQUE: Contiguous axial images were obtained from the base of the skull through the vertex without intravenous contrast. COMPARISON:  Maxillofacial CT 11/19/2007 FINDINGS: Brain: No evidence of acute infarction, hemorrhage, hydrocephalus, extra-axial collection, visible mass lesion or mass effect. Vascular: No hyperdense vessel or unexpected calcification. Skull: No calvarial fracture or suspicious osseous lesion. No scalp swelling or hematoma. Sinuses/Orbits: Mural thickening in the paranasal sinuses most pronounced in the right ethmoids, frontal sinus and left sphenoid sinus and lateral recess. No layering air-fluid levels. Mastoid air cells are predominantly clear. Middle ear cavities are clear. Included orbital structures are unremarkable. Other: None IMPRESSION: 1. No acute  intracranial findings. 2. Pansinus disease. Electronically Signed   By: Kreg ShropshirePrice  DeHay M.D.   On: 10/28/2020 20:51   CT Cervical Spine Wo Contrast  Result Date: 10/29/2020 CLINICAL DATA:  Three witnessed seizures about 30 seconds each occurring 5-7 minutes apart, was post ictal following seizures but now alert and oriented, single episode urinary incontinence EXAM: CT HEAD WITHOUT CONTRAST CT CERVICAL SPINE WITHOUT CONTRAST TECHNIQUE: Multidetector CT imaging of the head and cervical spine was performed following the standard protocol without intravenous contrast. Multiplanar CT image reconstructions of the cervical spine were also generated. COMPARISON:  10/28/2020 CT head FINDINGS: CT HEAD FINDINGS Brain: Normal ventricular morphology. No midline shift or mass effect. Normal appearance of brain parenchyma. No intracranial hemorrhage, mass lesion, evidence of acute infarction, or extra-axial fluid collection. Vascular: No hyperdense vessels Skull: Intact Sinuses/Orbits: Opacified RIGHT frontal sinus and LEFT sphenoid sinus unchanged. Other: N/A CT CERVICAL SPINE FINDINGS Alignment: Normal Skull base and vertebrae: Osseous mineralization normal. Skull base intact. Vertebral body  and disc space heights maintained. Facet alignments normal. No fracture, subluxation, or bone destruction. Soft tissues and spinal canal: Prevertebral soft tissues normal thickness. Large calcification LEFT submandibular gland question sialolith. Disc levels:  No specific abnormalities Upper chest: Lung apices clear Other: N/A IMPRESSION: No acute intracranial abnormalities. 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 No acute cervical spine abnormalities. Large calcification in LEFT submandibular gland question sialolith. Electronically Signed   By: Ulyses Southward M.D.   On: 10/29/2020 15:09   EEG adult  Result Date: 10/30/2020 Charlsie Quest, MD     10/30/2020  8:50 PM Patient Name: ROYER CRISTOBAL MRN: [id number] Epilepsy  Attending: Charlsie Quest Referring Physician/Provider: Dr Burnadette Pop Date: 10/30/2020 Duration: 22.45 mins Patient history: 24 year old male with a history of seizures with the sudden abrupt increase in seizure frequency despite addition of Keppra. EEG to evaluate for seizure Level of alertness: Awake AEDs during EEG study: LEV Technical aspects: This EEG study was done with scalp electrodes positioned according to the 10-20 International system of electrode placement. Electrical activity was acquired at a sampling rate of 500Hz  and reviewed with a high frequency filter of 70Hz  and a low frequency filter of 1Hz . EEG data were recorded continuously and digitally stored. Description: The posterior dominant rhythm consists of 10 Hz activity of moderate voltage (25-35 uV) seen predominantly in posterior head regions, symmetric and reactive to eye opening and eye closing.  Hyperventilation and photic stimulation were not performed.   IMPRESSION: This study is within normal limits. No seizures or epileptiform discharges were seen throughout the recording. Priyanka   Overnight EEG with video  Result Date: 10/31/2020 , MD     10/31/2020  9:50 AM Patient Name: ZYIRE EIDSON MRN: Charlsie Quest Epilepsy Attending: 14/06/2020 Referring Physician/Provider: Dr Ollen Gross Duration: 10/30/2020 1744 to 10/31/2020 0945  Patient history: 24 year old male with a history of seizures with the sudden abrupt increase in seizure frequency despite addition of Keppra. EEG to evaluate for seizure  Level of alertness: Awake, asleep  AEDs during EEG study: LEV  Technical aspects: This EEG study was done with scalp electrodes positioned according to the 10-20 International system of electrode placement. Electrical activity was acquired at a sampling rate of 500Hz  and reviewed with a high frequency filter of 70Hz  and a low frequency filter of 1Hz . EEG data were recorded continuously and digitally  stored.  Description: The posterior dominant rhythm consists of 10 Hz activity of moderate voltage (25-35 uV) seen predominantly in posterior head regions, symmetric and reactive to eye opening and eye closing.    Sleep was characterized by vertex waves, sleep spindles (12 to 14 Hz), maximal frontocentral region. Hyperventilation and photic stimulation were not performed.    IMPRESSION: This study is within normal limits. No seizures or epileptiform discharges were seen throughout the recording.  14/05/2020    Microbiology: Recent Results (from the past 240 hour(s))  Resp Panel by RT-PCR (Flu A&B, Covid) Nasopharyngeal Swab     Status: None   Collection Time: 10/29/20  4:08 PM   Specimen: Nasopharyngeal Swab; Nasopharyngeal(NP) swabs in vial transport medium  Result Value Ref Range Status   SARS Coronavirus 2 by RT PCR NEGATIVE NEGATIVE Final    Comment: (NOTE) SARS-CoV-2 target nucleic acids are NOT DETECTED.  The SARS-CoV-2 RNA is generally detectable in upper respiratory specimens during the acute phase of infection. The lowest concentration of SARS-CoV-2 viral copies this assay can detect is  138 copies/mL. A negative result does not preclude SARS-Cov-2 infection and should not be used as the sole basis for treatment or other patient management decisions. A negative result may occur with  improper specimen collection/handling, submission of specimen other than nasopharyngeal swab, presence of viral mutation(s) within the areas targeted by this assay, and inadequate number of viral copies(<138 copies/mL). A negative result must be combined with clinical observations, patient history, and epidemiological information. The expected result is Negative.  Fact Sheet for Patients:  BloggerCourse.com  Fact Sheet for Healthcare Providers:  SeriousBroker.it  This test is no t yet approved or cleared by the [place] FDA and  has  been authorized for detection and/or diagnosis of SARS-CoV-2 by FDA under an Emergency Use Authorization (EUA). This EUA will remain  in effect (meaning this test can be used) for the duration of the COVID-19 declaration under Section 564(b)(1) of the Act, 21 U.S.C.section 360bbb-3(b)(1), unless the authorization is terminated  or revoked sooner.       Influenza A by PCR NEGATIVE NEGATIVE Final   Influenza B by PCR NEGATIVE NEGATIVE Final    Comment: (NOTE) The Xpert Xpress SARS-CoV-2/FLU/RSV plus assay is intended as an aid in the diagnosis of influenza from Nasopharyngeal swab specimens and should not be used as a sole basis for treatment. Nasal washings and aspirates are unacceptable for Xpert Xpress SARS-CoV-2/FLU/RSV testing.  Fact Sheet for Patients: BloggerCourse.com  Fact Sheet for Healthcare Providers: SeriousBroker.it  This test is not yet approved or cleared by the [place] FDA and has been authorized for detection and/or diagnosis of SARS-CoV-2 by FDA under an Emergency Use Authorization (EUA). This EUA will remain in effect (meaning this test can be used) for the duration of the COVID-19 declaration under Section 564(b)(1) of the Act, 21 U.S.C. section 360bbb-3(b)(1), unless the authorization is terminated or revoked.  Performed at [facility], [address], [place], [place] [address]      Labs: Basic Metabolic Panel: Recent Labs  Lab 10/28/20 2019 10/29/20 1300 10/30/20 0500  NA 143 141 139  K 3.0* 3.7 3.6  CL 114* 106 108  CO2 21* 25 22  GLUCOSE 72 100* 90  BUN 9 10 12   CREATININE 0.81 1.13 1.04  CALCIUM 7.1* 8.9 8.5*   Liver Function Tests: Recent Labs  Lab 10/28/20 2019 10/29/20 1300  AST 19 26  ALT 47* 55*  ALKPHOS 40 45  BILITOT 0.7 1.1  PROT 5.7* 7.1  ALBUMIN 3.3* 4.3   CBC: Recent Labs  Lab 10/28/20 2019 10/29/20 1515 10/30/20 0500 10/30/20 1158  10/31/20 0025  WBC 7.3 7.9 5.4 6.8 8.1  NEUTROABS  --   --   --  3.3 3.7  HGB 11.9* 15.6 9.9* 16.0 15.4  HCT 34.3* 44.9 28.2* 45.4 42.5  MCV 91.0 89.1 89.5 86.1 86.7  PLT 214 308 176 301 310    Principal Problem:   Seizure (HCC) Active Problems:   Amphetamine abuse (HCC)   Time coordinating discharge: 35 minutes  Signed:  14/08/21, MD  [facility]  10/31/2020, 11:57 AM

## 2020-10-31 NOTE — Procedures (Addendum)
Patient Name: Earl [name]  MRN: [id number]  Epilepsy Attending: Charlsie Quest  Referring Physician/Provider: Dr Onalee Hua Duration: 10/30/2020 1744 to 10/31/2020 1322  Patient history: 24 year old male with a history of seizures with the sudden abrupt increase in seizure frequency despite addition of Keppra. EEG to evaluate for seizure  Level of alertness: Awake, asleep  AEDs during EEG study: LEV  Technical aspects: This EEG study was done with scalp electrodes positioned according to the 10-20 International system of electrode placement. Electrical activity was acquired at a sampling rate of 500Hz  and reviewed with a high frequency filter of 70Hz  and a low frequency filter of 1Hz . EEG data were recorded continuously and digitally stored.   Description: The posterior dominant rhythm consists of 10 Hz activity of moderate voltage (25-35 uV) seen predominantly in posterior head regions, symmetric and reactive to eye opening and eye closing.    Sleep was characterized by vertex waves, sleep spindles (12 to 14 Hz), maximal frontocentral region. Hyperventilation and photic stimulation were not performed.     IMPRESSION: This study is within normal limits. No seizures or epileptiform discharges were seen throughout the recording.  Richards Pherigo 

## 2020-10-31 NOTE — ED Triage Notes (Signed)
BIBA and [employer]  Per EMS;  Pt arriving from jail with 3 witnessed seizures  postital state upon EMS arrival  Hx seizures  Hx meth use  Vitals  146/100 114HR 22RR 100%  106 CBG

## 2020-11-01 ENCOUNTER — Emergency Department (HOSPITAL_COMMUNITY)

## 2020-11-01 ENCOUNTER — Observation Stay (HOSPITAL_COMMUNITY)
Admission: EM | Admit: 2020-11-01 | Discharge: 2020-11-03 | Disposition: A | Discharge status: Home / Self Care | Attending: Internal Medicine | Admitting: Internal Medicine

## 2020-11-01 ENCOUNTER — Other Ambulatory Visit: Payer: Self-pay

## 2020-11-01 ENCOUNTER — Observation Stay (HOSPITAL_COMMUNITY)

## 2020-11-01 DIAGNOSIS — Z20822 Contact with and (suspected) exposure to covid-19: Secondary | ICD-10-CM | POA: Insufficient documentation

## 2020-11-01 DIAGNOSIS — G40919 Epilepsy, unspecified, intractable, without status epilepticus: Secondary | ICD-10-CM | POA: Diagnosis present

## 2020-11-01 DIAGNOSIS — R569 Unspecified convulsions: Secondary | ICD-10-CM | POA: Diagnosis not present

## 2020-11-01 DIAGNOSIS — F1910 Other psychoactive substance abuse, uncomplicated: Secondary | ICD-10-CM | POA: Diagnosis not present

## 2020-11-01 DIAGNOSIS — F17210 Nicotine dependence, cigarettes, uncomplicated: Secondary | ICD-10-CM | POA: Insufficient documentation

## 2020-11-01 DIAGNOSIS — F17200 Nicotine dependence, unspecified, uncomplicated: Secondary | ICD-10-CM | POA: Diagnosis present

## 2020-11-01 DIAGNOSIS — Z91010 Allergy to peanuts: Secondary | ICD-10-CM | POA: Diagnosis not present

## 2020-11-01 DIAGNOSIS — F1510 Other stimulant abuse, uncomplicated: Secondary | ICD-10-CM | POA: Diagnosis present

## 2020-11-01 LAB — BASIC METABOLIC PANEL
Anion gap: 10 (ref 5–15)
BUN: 10 mg/dL (ref 6–20)
CO2: 23 mmol/L (ref 22–32)
Calcium: 8.7 mg/dL — ABNORMAL LOW (ref 8.9–10.3)
Chloride: 107 mmol/L (ref 98–111)
Creatinine, Ser: 1.02 mg/dL (ref 0.61–1.24)
GFR, Estimated: >60 mL/min (ref >60)
Glucose, Bld: 86 mg/dL (ref 70–99)
Potassium: 4.2 mmol/L (ref 3.5–5.1)
Sodium: 140 mmol/L (ref 135–145)

## 2020-11-01 LAB — RAPID URINE DRUG SCREEN, HOSP PERFORMED
Amphetamines: NOT DETECTED
Barbiturates: NOT DETECTED
Benzodiazepines: NOT DETECTED
Cocaine: NOT DETECTED
Opiates: NOT DETECTED
Tetrahydrocannabinol: NOT DETECTED

## 2020-11-01 LAB — RESP PANEL BY RT-PCR (FLU A&B, COVID) ARPGX2
Influenza A by PCR: NEGATIVE
Influenza B by PCR: NEGATIVE
SARS Coronavirus 2 by RT PCR: NEGATIVE

## 2020-11-01 LAB — CBC WITH DIFFERENTIAL/PLATELET
Abs Immature Granulocytes: 0.03 10*3/uL (ref 0.00–0.07)
Basophils Absolute: 0 10*3/uL (ref 0.0–0.1)
Basophils Relative: 1 %
Eosinophils Absolute: 0.2 10*3/uL (ref 0.0–0.5)
Eosinophils Relative: 3 %
HCT: 45 % (ref 39.0–52.0)
Hemoglobin: 15.3 g/dL (ref 13.0–17.0)
Immature Granulocytes: 0 %
Lymphocytes Relative: 38 %
Lymphs Abs: 2.8 10*3/uL (ref 0.7–4.0)
MCH: 30.7 pg (ref 26.0–34.0)
MCHC: 34 g/dL (ref 30.0–36.0)
MCV: 90.4 fL (ref 80.0–100.0)
Monocytes Absolute: 0.8 10*3/uL (ref 0.1–1.0)
Monocytes Relative: 10 %
Neutro Abs: 3.5 10*3/uL (ref 1.7–7.7)
Neutrophils Relative %: 48 %
Platelets: 301 10*3/uL (ref 150–400)
RBC: 4.98 MIL/uL (ref 4.22–5.81)
RDW: 12.4 % (ref 11.5–15.5)
WBC: 7.3 10*3/uL (ref 4.0–10.5)
nRBC: 0 % (ref 0.0–0.2)

## 2020-11-01 MED ORDER — ENOXAPARIN SODIUM 40 MG/0.4ML ~~LOC~~ SOLN
40.0000 mg | SUBCUTANEOUS | Status: DC
  Administered 2020-11-01 – 2020-11-02 (×2): 40 mg via SUBCUTANEOUS
  Filled 2020-11-01 (×2): qty 0.4

## 2020-11-01 MED ORDER — ONDANSETRON HCL 4 MG/2ML IJ SOLN: 4.0000 mg | Freq: Four times a day (QID) | INTRAMUSCULAR | Status: DC | PRN

## 2020-11-01 MED ORDER — DOCUSATE SODIUM 100 MG PO CAPS
100.0000 mg | ORAL_CAPSULE | Freq: Two times a day (BID) | ORAL | Status: DC
  Administered 2020-11-01 – 2020-11-02 (×3): 100 mg via ORAL
  Filled 2020-11-01 (×5): qty 1

## 2020-11-01 MED ORDER — ONDANSETRON HCL 4 MG PO TABS: 4.0000 mg | ORAL_TABLET | Freq: Four times a day (QID) | ORAL | Status: DC | PRN

## 2020-11-01 MED ORDER — SODIUM CHLORIDE 0.9 % IV SOLN
75.0000 mL/h | INTRAVENOUS | Status: DC
  Administered 2020-11-01: 75 mL/h via INTRAVENOUS

## 2020-11-01 MED ORDER — ACETAMINOPHEN 325 MG PO TABS
650.0000 mg | ORAL_TABLET | ORAL | Status: DC | PRN
  Administered 2020-11-02 – 2020-11-03 (×2): 650 mg via ORAL
  Filled 2020-11-01 (×3): qty 2

## 2020-11-01 MED ORDER — POLYETHYLENE GLYCOL 3350 17 G PO PACK: 17.0000 g | PACK | Freq: Every day | ORAL | Status: DC | PRN

## 2020-11-01 MED ORDER — LEVETIRACETAM IN NACL 1000 MG/100ML IV SOLN
1000.0000 mg | Freq: Once | INTRAVENOUS | Status: AC
  Administered 2020-11-01: 1000 mg via INTRAVENOUS
  Filled 2020-11-01: qty 100

## 2020-11-01 MED ORDER — LEVETIRACETAM 500 MG PO TABS
1000.0000 mg | ORAL_TABLET | Freq: Two times a day (BID) | ORAL | Status: DC
  Administered 2020-11-01 – 2020-11-03 (×4): 1000 mg via ORAL
  Filled 2020-11-01 (×6): qty 2

## 2020-11-01 MED ORDER — LORAZEPAM 2 MG/ML IJ SOLN
1.0000 mg | INTRAMUSCULAR | Status: DC | PRN
  Administered 2020-11-02 (×2): 2 mg via INTRAVENOUS
  Filled 2020-11-01 (×3): qty 1

## 2020-11-01 MED ORDER — ACETAMINOPHEN 650 MG RE SUPP: 650.0000 mg | RECTAL | Status: DC | PRN

## 2020-11-01 NOTE — ED Provider Notes (Signed)
[facility] EMERGENCY DEPARTMENT Provider Note   CSN: [id number] Arrival date & time: 11/01/20  1205    History Chief Complaint  Patient presents with  . Seizures    Earl [name] is a 24 y.o. male with past medical history significant for seizures, amphetamine use to presents for evaluation of seizures.  Seen with admission to hospitalist service for seizure-like activity and discharge yesterday 10/31/2020.  Her EEG did not have any seizure-like activity for greater than 24 hours on increased Keppra dose.  Was returned back to the emergency department yesterday evening after discharge for reported increased seizures.  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  According to jail nurses patient with 2, tonic, clonic seizures lasting approximately 20 seconds apiece.  Once EMS was called patient had additional 2 seizures lasting approximately 10 seconds.  Had episode of lip biting per patient however no urinary incontinence.  Has been taking Keppra as prescribed.  This drug use 10/24/2020.  He has had with his last seizure he fell and hit his head to the temporal region.  He denies any preceding sudden onset thunderclap headache, vision changes, paresthesias, weakness, emesis, chest pain, shortness breath abdominal pain, diarrhea, dysuria.  Patient does admit that he feels stressed recently being incarcerated.  Denies additional aggravating or alleviating factors.   Previously on Seizure meds dc 1 year ago, he is unsure of meds. No EtOH use. No meth since 10/24/2020. No additional illicit substances. No fever, chills, neck pain, neck stiffness, sudden onset thunderclap HA, weakness, paresthesias, recent injury or trauma.   History obtained from patient and past medical records.  No interpreter used.    HPI     Past Medical History:  Diagnosis Date  . Seizures [facility])     Patient Active Problem List   Diagnosis Date  Noted  . 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 Polysubstance abuse (HCC) 11/01/2020  . Tobacco dependence 11/01/2020  . Incarceration 11/01/2020  . 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 Seizure (HCC) 10/29/2020    Past Surgical History:  Procedure Laterality Date  . OPEN REDUCTION INTERNAL FIXATION (ORIF) HAND Left        No family history on file.  Social History   Tobacco Use  . Smoking status: Current Every Day Smoker    Types: Cigarettes  . Smokeless tobacco: Never Used  Vaping Use  . Vaping Use: Never used  Substance Use Topics  . Alcohol use: Yes  . Drug use: No    Home Medications Prior to Admission medications   Medication Sig Start Date End Date Taking? Authorizing Provider  acetaminophen (TYLENOL) 325 MG tablet Take 650 mg by mouth 3 (three) times daily as needed for moderate pain.    [provider]  levETIRAcetam (KEPPRA) 750 MG tablet Take 2 tablets (1,500 mg total) by mouth 2 (two) times daily. 10/31/20   Benjiman Core, MD  loperamide (IMODIUM A-D) 2 MG tablet Take 2 mg by mouth 2 (two) times daily as needed for diarrhea or loose stools.    [provider]    Allergies    Peanut-containing drug products  Review of Systems   Review of Systems  Constitutional: Negative.   HENT: Negative.   Respiratory: Negative.   Cardiovascular: Negative.   Gastrointestinal: Negative.   Genitourinary: Negative.   Musculoskeletal: Negative.   Skin: Negative.   Neurological: Positive for headaches. Negative for dizziness, tremors, seizures, syncope, facial asymmetry, speech difficulty, weakness, light-headedness and numbness.  All other systems reviewed and are negative.   Physical Exam Updated Vital Signs BP (!) 154/129 (BP Location: Right Arm)   Pulse 86   Temp 98.4 F (36.9 C) (Oral)   Resp 18   Ht 6\' 1"  (1.854 m)   Wt 78 kg   SpO2 99%   BMI 22.69 kg/m   Physical Exam Physical Exam  Constitutional: Pt is oriented to person,  place, and time. Pt appears well-developed and well-nourished. No distress.  HENT:  Head: Normocephalic and atraumatic.  Mouth/Throat: Oropharynx is clear and moist. Minimal abrasion to anterior lower lip. No tongue injury. Eyes: Conjunctivae and EOM are normal. Pupils are equal, round, and reactive to light. No scleral icterus.  No horizontal, vertical or rotational nystagmus  Neck: Normal range of motion. Neck supple.  Full active and passive ROM without pain No midline or paraspinal tenderness No nuchal rigidity or meningeal signs  Cardiovascular: Normal rate, regular rhythm and intact distal pulses.   Pulmonary/Chest: Effort normal and breath sounds normal. No respiratory distress. Pt has no wheezes. No rales.  Abdominal: Soft. Bowel sounds are normal. There is no tenderness. There is no rebound and no guarding.  Musculoskeletal: Normal range of motion. Full ROM without difficulty. No bony tenerness Lymphadenopathy:    No cervical adenopathy.  Neurological: Pt. is alert and oriented to person, place, and time. He has normal reflexes. No cranial nerve deficit.  Exhibits normal muscle tone. Coordination normal.  Mental Status:  Alert, oriented, thought content appropriate. Speech fluent without evidence of aphasia. Able to follow 2 step commands without difficulty.  Cranial Nerves:  II:  Peripheral visual fields grossly normal, pupils equal, round, reactive to light III,IV, VI: ptosis not present, extra-ocular motions intact bilaterally  V,VII: smile symmetric, facial light touch sensation equal VIII: hearing grossly normal bilaterally  IX,X: midline uvula rise  XI: bilateral shoulder shrug equal and strong XII: midline tongue extension  Motor:  5/5 in upper and lower extremities bilaterally including strong and equal grip strength and dorsiflexion/plantar flexion Sensory: Pinprick and light touch normal in all extremities.  Deep Tendon Reflexes: 2+ and symmetric  Cerebellar: normal  finger-to-nose with bilateral upper extremities Gait: normal gait and balance CV: distal pulses palpable throughout   Skin: Skin is warm and dry. No rash noted. Pt is not diaphoretic.  Psychiatric: Pt has a normal mood and affect. Behavior is normal. Judgment and thought content normal.  Nursing note and vitals reviewed. ED Results / Procedures / Treatments   Labs (all labs ordered are listed, but only abnormal results are displayed) Labs Reviewed  BASIC METABOLIC PANEL - Abnormal; Notable for the following components:      Result Value   Calcium 8.7 (*)    All other components within normal limits  RESP PANEL BY RT-PCR (FLU A&B, COVID) ARPGX2  CBC WITH DIFFERENTIAL/PLATELET  RAPID URINE DRUG SCREEN, HOSP PERFORMED  LEVETIRACETAM LEVEL    EKG EKG Interpretation  Date/Time:  Thursday November 01 2020 12:06:12 EST Ventricular Rate:  86 PR Interval:    QRS Duration: 95 QT Interval:  384 QTC Calculation: 460 R Axis:   80 Text Interpretation: Sinus rhythm Borderline T wave abnormalities ST elevation, consider anterolateral injury Interpretation limited secondary to artifact No significant change since last tracing Confirmed by [id number] [phone]) on 11/01/2020 12:22:47 PM   Radiology CT Head Wo Contrast  Result Date: 11/01/2020 CLINICAL DATA:  Seizure with trauma to the head.  Headache. EXAM: CT HEAD WITHOUT CONTRAST CT CERVICAL SPINE  WITHOUT CONTRAST TECHNIQUE: Multidetector CT imaging of the head and cervical spine was performed following the standard protocol without intravenous contrast. Multiplanar CT image reconstructions of the cervical spine were also generated. COMPARISON:  10/29/2020. FINDINGS: CT HEAD FINDINGS Brain: The brain shows a normal appearance without evidence of malformation, atrophy, old or acute small or large vessel infarction, mass lesion, hemorrhage, hydrocephalus or extra-axial collection. Vascular: No hyperdense vessel. No evidence of atherosclerotic  calcification. Skull: Normal.  No traumatic finding.  No focal bone lesion. Sinuses/Orbits: Subtotal opacification of the right frontal sinus. Other sinuses are clear. Orbits negative. Other: None significant CT CERVICAL SPINE FINDINGS Alignment: Normal Skull base and vertebrae: Normal Soft tissues and spinal canal: Normal Disc levels:  Normal Upper chest: Normal Other: None IMPRESSION: HEAD CT: Normal except for subtotal opacification of the right frontal sinus. CERVICAL SPINE CT: Normal. Electronically Signed   By: Paulina FusiMark  Shogry M.D.   On: 11/01/2020 13:39   CT Cervical Spine Wo Contrast  Result Date: 11/01/2020 CLINICAL DATA:  Seizure with trauma to the head.  Headache. EXAM: CT HEAD WITHOUT CONTRAST CT CERVICAL SPINE WITHOUT CONTRAST TECHNIQUE: Multidetector CT imaging of the head and cervical spine was performed following the standard protocol without intravenous contrast. Multiplanar CT image reconstructions of the cervical spine were also generated. COMPARISON:  10/29/2020. FINDINGS: CT HEAD FINDINGS Brain: The brain shows a normal appearance without evidence of malformation, atrophy, old or acute small or large vessel infarction, mass lesion, hemorrhage, hydrocephalus or extra-axial collection. Vascular: No hyperdense vessel. No evidence of atherosclerotic calcification. Skull: Normal.  No traumatic finding.  No focal bone lesion. Sinuses/Orbits: Subtotal opacification of the right frontal sinus. Other sinuses are clear. Orbits negative. Other: None significant CT CERVICAL SPINE FINDINGS Alignment: Normal Skull base and vertebrae: Normal Soft tissues and spinal canal: Normal Disc levels:  Normal Upper chest: Normal Other: None IMPRESSION: HEAD CT: Normal except for subtotal opacification of the right frontal sinus. CERVICAL SPINE CT: Normal. Electronically Signed   By: Paulina FusiMark  Shogry M.D.   On: 11/01/2020 13:39   EEG adult  Result Date: 10/30/2020 Charlsie QuestYadav, Priyanka O, MD     10/30/2020  8:50 PM Patient  Name: Earl [name] MRN: [id number] Epilepsy Attending: Charlsie QuestPriyanka O Yadav Referring Physician/Provider: Dr Burnadette PopAmrit Adhikari Date: 10/30/2020 Duration: 22.45 mins Patient history: 24 year old male with a history of seizures with the sudden abrupt increase in seizure frequency despite addition of Keppra. EEG to evaluate for seizure Level of alertness: Awake AEDs during EEG study: LEV Technical aspects: This EEG study was done with scalp electrodes positioned according to the 10-20 International system of electrode placement. Electrical activity was acquired at a sampling rate of 500Hz  and reviewed with a high frequency filter of 70Hz  and a low frequency filter of 1Hz . EEG data were recorded continuously and digitally stored. Description: The posterior dominant rhythm consists of 10 Hz activity of moderate voltage (25-35 uV) seen predominantly in posterior head regions, symmetric and reactive to eye opening and eye closing.  Hyperventilation and photic stimulation were not performed.   IMPRESSION: This study is within normal limits. No seizures or epileptiform discharges were seen throughout the recording. Priyanka [name] Yadav   Overnight EEG with video  Result Date: 10/31/2020 Charlsie QuestYadav, Priyanka O, MD     10/31/2020  2:53 PM Patient Name: Earl [name] MRN: [id number] Epilepsy Attending: Charlsie QuestPriyanka O Yadav Referring Physician/Provider: Dr Onalee HuaMcNeil Kirkpatrick Duration: 10/30/2020 1744 to 10/31/2020 1322  Patient history: 24 year old male with a history of seizures with the  sudden abrupt increase in seizure frequency despite addition of Keppra. EEG to evaluate for seizure  Level of alertness: Awake, asleep  AEDs during EEG study: LEV  Technical aspects: This EEG study was done with scalp electrodes positioned according to the 10-20 International system of electrode placement. Electrical activity was acquired at a sampling rate of 500Hz  and reviewed with a high frequency filter of 70Hz  and a low frequency filter of 1Hz . EEG  data were recorded continuously and digitally stored.  Description: The posterior dominant rhythm consists of 10 Hz activity of moderate voltage (25-35 uV) seen predominantly in posterior head regions, symmetric and reactive to eye opening and eye closing.    Sleep was characterized by vertex waves, sleep spindles (12 to 14 Hz), maximal frontocentral region. Hyperventilation and photic stimulation were not performed.    IMPRESSION: This study is within normal limits. No seizures or epileptiform discharges were seen throughout the recording.    Procedures Procedures (including critical care time)  Medications Ordered in ED Medications  enoxaparin (LOVENOX) injection 40 mg (has no administration in time range)  0.9 %  sodium chloride infusion (has no administration in time range)  LORazepam (ATIVAN) injection 1-2 mg (has no administration in time range)  acetaminophen (TYLENOL) tablet 650 mg (has no administration in time range)    Or  acetaminophen (TYLENOL) suppository 650 mg (has no administration in time range)  docusate sodium (COLACE) capsule 100 mg (has no administration in time range)  polyethylene glycol (MIRALAX / GLYCOLAX) packet 17 g (has no administration in time range)  ondansetron (ZOFRAN) tablet 4 mg (has no administration in time range)    Or  ondansetron (ZOFRAN) injection 4 mg (has no administration in time range)  levETIRAcetam (KEPPRA) IVPB 1000 mg/100 mL premix (0 mg Intravenous Stopped 11/01/20 1407)   ED Course  I have reviewed the triage vital signs and the nursing notes.  Pertinent labs & imaging results that were available during my care of the patient were reviewed by me and considered in my medical decision making (see chart for details).  24 year old presents for evaluation of recurrent seizure.  Afebrile, nonseptic, not ill-appearing.  Currently seizures as a child.  Recent hospital admission with discharge 10/31/2020.  3 presents emergency  department after discharge for increasing seizures.  Sent home.  Currently had additional 4 seizures today.  Apparently did have some lip biting however no tongue injury, urinary incontinence.  Patient states he fell forward during one of his seizures and hit the anterior aspect of his head.  He has a nonfocal neuro exam without deficits.  Has been compliant with his Keppra.  We will plan on repeat CT head, lab work, consult with neurology.  Labs and imaging personally reviewed and interpreted:  CBC without leukocytosis, hemoglobin 15.3 Metabolic panel with calcium 8.7, no additional electrolyte, renal abnormality Keppra level pending UDS pending EKG without STEMI CT head, CT cervical without acute abnormality  Patient with reassuring labs and imaging.  We'll plan on consulting with neurology given this is patient's third visit in the last 24 hours for seizure-like activity.  At this time I have low suspicion for ICH, EtOH withdrawal, infectious process, metabolic derangment  as cause of his seizure like activity.  Patient with a lacerations, bony tenderness to suggest musculoskeletal pathology from his possible fall after his seizure-like activity.  He continues have nonfocal neuro exam without deficits.  Ambulatory without difficulty without any emesis   CONSULT with Dr. 14/9/21  with Neurology. States admit to hospitalist, neuro will consult will likely need continuous EEG given increase in seizure  CONSULT with Dr. Ophelia Charter with [facility] who will evaluate patient for admission.  The patient appears reasonably stabilized for admission considering the current resources, flow, and capabilities available in the ED at this time, and I doubt any other [facility] requiring further screening and/or treatment in the ED prior to admission.     MDM Rules/Calculators/A&P                          Final Clinical Impression(s) / ED Diagnoses Final diagnoses:  Seizure [facility])    Rx / DC Orders ED Discharge  Orders    None       Tika Hannis A, PA-C 11/01/20 1526    Vanetta Mulders, MD 11/02/20 [phone]

## 2020-11-01 NOTE — ED Notes (Signed)
Pt provided pillow, vss on ccm. No other complaints at this time.

## 2020-11-01 NOTE — ED Notes (Signed)
Pt ambulatory to restroom

## 2020-11-01 NOTE — H&P (Signed)
History and Physical    Earl [name] EAV:[id number] DOB: Dec 10, 1995 DOA: 11/01/2020  PCP: Patient, No Pcp Per Consultants:  None Patient coming from: [place]; NOK: None  Chief Complaint: seizures  HPI: Earl [name] is a 24 y.o. male with medical history significant of remote seizure d/o who had been off medications but who has had recurrent seizures recently.  He was hospitalized for this from 12/6-8 (discharged 1 day ago) and returns with recurrent seizures now despite Keppra.  He reports having been off seizure medications for years.  His last seizure prior to recently was maybe a year ago.  He was initially seen on 12/5 for seizure activity with lower lip contusion; he stabilized and was discharged back to jail.  He returned on 12/6 and was admitted; he remained seizure free for more than 24 hours and continuous EEG was normal.  He reportedly had 3 more seizures on 12/8 after hospital discharge (despite none while hospitalized) and was again discharged.  He had 2 additional seizures and was once again sent in.   He reports that he is currently in jail for 20 days due to probation violation.  He is an Designer, jewellery at [employer], Environmental manager.  Prior to incarceration, he used daily ETOH, marijuana, tobacco, and methamphetamine.    ED Course: h/o childhood seizures, off meds, now incarcerated and recurrent seizures.  More activity in jail and with EMS.  Neurology recommends continuous EEG.  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  Review of Systems: As per HPI; otherwise review of systems reviewed and negative.   Ambulatory Status:  Ambulates without assistance   Past Medical History:  Diagnosis Date  . Seizures (HCC)     Past Surgical History:  Procedure Laterality Date  . OPEN REDUCTION INTERNAL FIXATION (ORIF) HAND Left     Social History   Socioeconomic History  . Marital status: Single    Spouse name: Not on file  . Number of children: Not on  file  . Years of education: Not on file  . Highest education level: Not on file  Occupational History  . Not on file  Tobacco Use  . Smoking status: Current Every Day Smoker    Types: Cigarettes  . Smokeless tobacco: Never Used  Vaping Use  . Vaping Use: Never used  Substance and Sexual Activity  . Alcohol use: Yes  . Drug use: No  . Sexual activity: Not on file  Other Topics Concern  . Not on file  Social History Narrative  . Not on file   Social Determinants of Health   Financial Resource Strain: Not on file  Food Insecurity: Not on file  Transportation Needs: Not on file  Physical Activity: Not on file  Stress: Not on file  Social Connections: Not on file  Intimate Partner Violence: Not on file    Allergies  Allergen Reactions  . Peanut-Containing Drug Products Anaphylaxis    hives    No family history on file.  Prior to Admission medications   Medication Sig Start Date End Date Taking? Authorizing Provider  acetaminophen (TYLENOL) 325 MG tablet Take 650 mg by mouth 3 (three) times daily as needed for moderate pain.    [provider]  levETIRAcetam (KEPPRA) 750 MG tablet Take 2 tablets (1,500 mg total) by mouth 2 (two) times daily. 10/31/20   Benjiman Core, MD  loperamide (IMODIUM A-D) 2 MG tablet Take 2 mg by mouth 2 (two) times daily as needed for diarrhea or loose stools.  [provider]    Physical Exam: Vitals:   11/01/20 1300 11/01/20 1345 11/01/20 1400 11/01/20 1502  BP: (!) 136/95 (!) 140/108 (!) 143/106 (!) 154/129  Pulse: 92 90 93 86  Resp: 15 20 (!) 26 18  Temp:      TempSrc:      SpO2: 100% 100% 100% 99%  Weight:      Height:         . General:  Appears calm and comfortable and is NAD . Eyes:  EOMI, normal lids, iris . ENT:  grossly normal hearing, lips (healing contusion without new apparent injury) & tongue (no trauma appreciated), mmm . Neck:  no LAD, masses or thyromegaly . Cardiovascular:  RRR, no m/r/g. No  LE edema.  Marland Kitchen Respiratory:   CTA bilaterally with no wheezes/rales/rhonchi.  Normal respiratory effort. . Abdomen:  soft, NT, ND, NABS . Skin:  no rash or induration seen on limited exam . Musculoskeletal:  grossly normal tone BUE/BLE, good ROM, no bony abnormality; shackled to the bed . Psychiatric:  grossly normal mood and affect, speech fluent and appropriate, AOx3 . Neurologic:  CN 2-12 grossly intact, moves all extremities in coordinated fashion    Radiological Exams on Admission: Independently reviewed - see discussion in A/P where applicable  CT Head Wo Contrast  Result Date: 11/01/2020 CLINICAL DATA:  Seizure with trauma to the head.  Headache. EXAM: CT HEAD WITHOUT CONTRAST CT CERVICAL SPINE WITHOUT CONTRAST TECHNIQUE: Multidetector CT imaging of the head and cervical spine was performed following the standard protocol without intravenous contrast. Multiplanar CT image reconstructions of the cervical spine were also generated. COMPARISON:  10/29/2020. FINDINGS: CT HEAD FINDINGS Brain: The brain shows a normal appearance without evidence of malformation, atrophy, old or acute small or large vessel infarction, mass lesion, hemorrhage, hydrocephalus or extra-axial collection. Vascular: No hyperdense vessel. No evidence of atherosclerotic calcification. Skull: Normal.  No traumatic finding.  No focal bone lesion. Sinuses/Orbits: Subtotal opacification of the right frontal sinus. Other sinuses are clear. Orbits negative. Other: None significant CT CERVICAL SPINE FINDINGS Alignment: Normal Skull base and vertebrae: Normal Soft tissues and spinal canal: Normal Disc levels:  Normal Upper chest: Normal Other: None IMPRESSION: HEAD CT: Normal except for subtotal opacification of the right frontal sinus. CERVICAL SPINE CT: Normal. Electronically Signed   By: Paulina Fusi M.D.   On: 11/01/2020 13:39   CT Cervical Spine Wo Contrast  Result Date: 11/01/2020 CLINICAL DATA:  Seizure with trauma to the  head.  Headache. EXAM: CT HEAD WITHOUT CONTRAST CT CERVICAL SPINE WITHOUT CONTRAST TECHNIQUE: Multidetector CT imaging of the head and cervical spine was performed following the standard protocol without intravenous contrast. Multiplanar CT image reconstructions of the cervical spine were also generated. COMPARISON:  10/29/2020. FINDINGS: CT HEAD FINDINGS Brain: The brain shows a normal appearance without evidence of malformation, atrophy, old or acute small or large vessel infarction, mass lesion, hemorrhage, hydrocephalus or extra-axial collection. Vascular: No hyperdense vessel. No evidence of atherosclerotic calcification. Skull: Normal.  No traumatic finding.  No focal bone lesion. Sinuses/Orbits: Subtotal opacification of the right frontal sinus. Other sinuses are clear. Orbits negative. Other: None significant CT CERVICAL SPINE FINDINGS Alignment: Normal Skull base and vertebrae: Normal Soft tissues and spinal canal: Normal Disc levels:  Normal Upper chest: Normal Other: None IMPRESSION: HEAD CT: Normal except for subtotal opacification of the right frontal sinus. CERVICAL SPINE CT: Normal. Electronically Signed   By: Paulina Fusi M.D.   On: 11/01/2020 13:39  EEG adult  Result Date: 10/30/2020 Charlsie Quest, MD     10/30/2020  8:50 PM Patient Name: ZYAN COBY MRN: [id number] Epilepsy Attending: Charlsie Quest Referring Physician/Provider: Dr Burnadette Pop Date: 10/30/2020 Duration: 22.45 mins Patient history: 24 year old male with a history of seizures with the sudden abrupt increase in seizure frequency despite addition of Keppra. EEG to evaluate for seizure Level of alertness: Awake AEDs during EEG study: LEV Technical aspects: This EEG study was done with scalp electrodes positioned according to the 10-20 International system of electrode placement. Electrical activity was acquired at a sampling rate of 500Hz  and reviewed with a high frequency filter of 70Hz  and a low frequency filter of 1Hz .  EEG data were recorded continuously and digitally stored. Description: The posterior dominant rhythm consists of 10 Hz activity of moderate voltage (25-35 uV) seen predominantly in posterior head regions, symmetric and reactive to eye opening and eye closing.  Hyperventilation and photic stimulation were not performed.   IMPRESSION: This study is within normal limits. No seizures or epileptiform discharges were seen throughout the recording. Priyanka   Overnight EEG with video  Result Date: 10/31/2020 , MD     10/31/2020  2:53 PM Patient Name: ZAKYE BABY MRN: Charlsie Quest Epilepsy Attending: 14/06/2020 Referring Physician/Provider: Dr Ollen Gross Duration: 10/30/2020 1744 to 10/31/2020 1322  Patient history: 24 year old male with a history of seizures with the sudden abrupt increase in seizure frequency despite addition of Keppra. EEG to evaluate for seizure  Level of alertness: Awake, asleep  AEDs during EEG study: LEV  Technical aspects: This EEG study was done with scalp electrodes positioned according to the 10-20 International system of electrode placement. Electrical activity was acquired at a sampling rate of 500Hz  and reviewed with a high frequency filter of 70Hz  and a low frequency filter of 1Hz . EEG data were recorded continuously and digitally stored.  Description: The posterior dominant rhythm consists of 10 Hz activity of moderate voltage (25-35 uV) seen predominantly in posterior head regions, symmetric and reactive to eye opening and eye closing.    Sleep was characterized by vertex waves, sleep spindles (12 to 14 Hz), maximal frontocentral region. Hyperventilation and photic stimulation were not performed.    IMPRESSION: This study is within normal limits. No seizures or epileptiform discharges were seen throughout the recording.  Priyanka 14/05/2020    EKG: Independently reviewed.  NSR with rate 86; nonspecific ST changes with no significant changes  since last tracing   Labs on Admission: I have personally reviewed the available labs and imaging studies at the time of the admission.  Pertinent labs:   Unremarkable BMP Normal CBC   Assessment/Plan Principal Problem:   Refractory seizure (HCC) Active Problems:   Amphetamine abuse (HCC)   Polysubstance abuse (HCC)   Tobacco dependence   Incarceration   Recurrent seizures -Patient with 2 prior ER visits and 1 admission due to recurrent seizures -He does have a remote h/o childhood seizures and has not taken medications in years -It is possible that recent increased seizure activity is associated with drug/alcohol withdrawal -Also may be associated with medication not being provided at the jail -Final consideration is for non-epileptic seizures -Patient placed in observation overnight for further evaluation -Neurology has seen the patient -Will defer Keppra dosing to neurology - when hospitalized prior, he did not have seizure activity on EEG. -Will order continuous EEG  -He also will need driving restriction for at least 6 months -  Seizure precautions -Ativan prn  Incarceration -Patient with h/o criminal activity, reports currently serving 20 days for probation violation  Polysubstance abuse -UDS + for methamphetamines on 12/5 and so last use may have been up to a week prior -Negative UDS on 12/ and 12/9 -Cessation encouraged  Tobacco dependence  -Encourage cessation.   -This was discussed with the patient and should be reviewed on an ongoing basis.      Note: This patient has been tested and is negative for the novel coronavirus COVID-19.     DVT prophylaxis: Lovenox  Code Status:  Full  Family Communication: Police officers x 2 present throughout evaluation Disposition Plan:  The patient is from: home  [facility]tic[facility]ated [facility]  Anticipated d/c date will depend on clinical response to treatment, but possibly as early as tomorrow if he  has excellent response to treatment  Patient is currently: acutely ill Consults called: Neurology  Admission status:  It is my clinical opinion that referral for OBSERVATION is reasonable and necessary in this patient based on the above information provided. The aforementioned taken together are felt to place the patient at high risk for further clinical deterioration. However it is anticipated that the patient may be medically stable for discharge from the hospital within 24 to 48 hours.    Jonah BlueJennifer Tajae Maiolo MD [facility]   How to contact the [facility] Attending or Consulting provider 7A - 7P or covering provider during after hours 7P -7A, for this patient?  1. Check the care team in [facility] and look for a) attending/consulting [facility] provider listed and b) the [facility] team listed 2. Log into [email or web address] and use [place]'s universal password to access. If you do not have the password, please contact the hospital operator. 3. Locate the [facility] provider you are looking for under [facility] and page to a number that you can be directly reached. 4. If you still have difficulty reaching the provider, please page the [facility] (Director on Call) for the Hospitalists listed on amion for assistance.   11/01/2020, 3:12 PM

## 2020-11-01 NOTE — ED Notes (Signed)
Got patient on the monitor did ekg shown to Dr Deretha Emory patient is resting with officers at bedside and call bell in reach

## 2020-11-01 NOTE — ED Provider Notes (Signed)
[facility]-EMERGENCY DEPT Provider Note   CSN: [id number] Arrival date & time: 10/31/20  1846     History Chief Complaint  Patient presents with  . Seizures    Earl [name] is a 24 y.o. male.  HPI Patient presents with possible seizures.  Discharged earlier today for same.  History of seizures when he was a child but then started again 3 days ago when he was in jail.  Initially seen in ER and started on Keppra.  Then admitted to the hospital after more seizures.  Had negative EEGs.  Had increase Keppra dose.  Discharge today and then went back to the jail and reportedly had 3 more seizures.  May have had some confusion after.  Bit his lip but no urinary incontinence.  Reportedly said he was sitting in bed and felt as if he could have the episode.  Sent back in for further evaluation.  Feels better now.  Unsure how long the episode was.  The officers with him were not there at the time.  Previous history of methamphetamine abuse.  Had not used in the last few days however.  Last drug screen was negative.  Head CT reassuring.    Past Medical History:  Diagnosis Date  . Seizures [facility])     Patient Active Problem List   Diagnosis Date Noted  . 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 Seizure (HCC) 10/29/2020    Past Surgical History:  Procedure Laterality Date  . OPEN REDUCTION INTERNAL FIXATION (ORIF) HAND Left        History reviewed. No pertinent family history.  Social History   Tobacco Use  . Smoking status: Current Every Day Smoker    Types: Cigarettes  . Smokeless tobacco: Never Used  Vaping Use  . Vaping Use: Never used  Substance Use Topics  . Alcohol use: Yes  . Drug use: No    Home Medications Prior to Admission medications   Medication Sig Start Date End Date Taking? Authorizing Provider  acetaminophen (TYLENOL) 325 MG tablet Take 650 mg by mouth 3 (three) times daily as needed for moderate pain.    [provider]   levETIRAcetam (KEPPRA) 750 MG tablet Take 2 tablets (1,500 mg total) by mouth 2 (two) times daily. 10/31/20   Benjiman Core, MD  loperamide (IMODIUM A-D) 2 MG tablet Take 2 mg by mouth 2 (two) times daily as needed for diarrhea or loose stools.    [provider]    Allergies    Peanut-containing drug products  Review of Systems   Review of Systems  Constitutional: Negative for appetite change.  HENT: Negative for congestion.   Respiratory: Negative for shortness of breath.   Gastrointestinal: Negative for abdominal pain.  Genitourinary: Negative for flank pain.  Musculoskeletal: Negative for back pain.  Skin: Negative for rash.  Neurological: Negative for weakness.  Psychiatric/Behavioral: Negative for confusion.    Physical Exam Updated Vital Signs BP 117/77   Pulse 100   Temp 98.5 F (36.9 C) (Oral)   Resp 10   SpO2 100%   Physical Exam Vitals and nursing note reviewed.  HENT:     Head: Normocephalic.     Comments: Small bruising to anterior lower lip.  No bite of tongue. Eyes:     Extraocular Movements: Extraocular movements intact.     Pupils: Pupils are equal, round, and reactive to light.  Cardiovascular:     Rate and Rhythm: Regular rhythm.     Heart sounds:  Normal heart sounds.  Pulmonary:     Breath sounds: Normal breath sounds.  Abdominal:     Tenderness: There is no abdominal tenderness.  Musculoskeletal:        General: No tenderness.     Cervical back: Neck supple.  Skin:    General: Skin is warm.     Capillary Refill: Capillary refill takes less than 2 seconds.  Neurological:     Mental Status: He is alert and oriented to person, place, and time.     ED Results / Procedures / Treatments   Labs (all labs ordered are listed, but only abnormal results are displayed) Labs Reviewed  LEVETIRACETAM LEVEL    EKG None  Radiology EEG adult  Result Date: 10/30/2020 Charlsie Quest, MD     10/30/2020  8:50 PM Patient Name: DARLENE BARTELT MRN: [id number] Epilepsy Attending: Charlsie Quest Referring Physician/Provider: Dr Burnadette Pop Date: 10/30/2020 Duration: 22.45 mins Patient history: 24 year old male with a history of seizures with the sudden abrupt increase in seizure frequency despite addition of Keppra. EEG to evaluate for seizure Level of alertness: Awake AEDs during EEG study: LEV Technical aspects: This EEG study was done with scalp electrodes positioned according to the 10-20 International system of electrode placement. Electrical activity was acquired at a sampling rate of 500Hz  and reviewed with a high frequency filter of 70Hz  and a low frequency filter of 1Hz . EEG data were recorded continuously and digitally stored. Description: The posterior dominant rhythm consists of 10 Hz activity of moderate voltage (25-35 uV) seen predominantly in posterior head regions, symmetric and reactive to eye opening and eye closing.  Hyperventilation and photic stimulation were not performed.   IMPRESSION: This study is within normal limits. No seizures or epileptiform discharges were seen throughout the recording. Priyanka   Overnight EEG with video  Result Date: 10/31/2020 , MD     10/31/2020  2:53 PM Patient Name: CILLIAN GWINNER MRN: Charlsie Quest Epilepsy Attending: 14/06/2020 Referring Physician/Provider: Dr Ollen Gross Duration: 10/30/2020 1744 to 10/31/2020 1322  Patient history: 24 year old male with a history of seizures with the sudden abrupt increase in seizure frequency despite addition of Keppra. EEG to evaluate for seizure  Level of alertness: Awake, asleep  AEDs during EEG study: LEV  Technical aspects: This EEG study was done with scalp electrodes positioned according to the 10-20 International system of electrode placement. Electrical activity was acquired at a sampling rate of 500Hz  and reviewed with a high frequency filter of 70Hz  and a low frequency filter of 1Hz . EEG data were recorded  continuously and digitally stored.  Description: The posterior dominant rhythm consists of 10 Hz activity of moderate voltage (25-35 uV) seen predominantly in posterior head regions, symmetric and reactive to eye opening and eye closing.    Sleep was characterized by vertex waves, sleep spindles (12 to 14 Hz), maximal frontocentral region. Hyperventilation and photic stimulation were not performed.    IMPRESSION: This study is within normal limits. No seizures or epileptiform discharges were seen throughout the recording.  14/05/2020    Procedures Procedures (including critical care time)  Medications Ordered in ED Medications  levETIRAcetam (KEPPRA) IVPB 1000 mg/100 mL premix (0 mg Intravenous Stopped 10/31/20 2037)    ED Course  I have reviewed the triage vital signs and the nursing notes.  Pertinent labs & imaging results that were available during my care of the patient were reviewed by me and considered in  my medical decision making (see chart for details).    MDM Rules/Calculators/A&P                         Patient with possible recurrent seizure.  Although reviewing notes neurology had some thought this could be pseudoseizures.  Patient is back at baseline.  Rather quickly back at baseline if he did have multiple seizures that were longer.  Has had reassuring EEGs.  Reported being compliant with medicine.  Will increase Keppra dose after discussion with neurology, Dr. Thomasena Edis.  Does not need further work-up or admission at this time.  Patient continues to be seizure-free.  Keppra level had been measured prior to loading.  Can be followed as an outpatient.  Discharge back to jail.  Final Clinical Impression(s) / ED Diagnoses Final diagnoses:  Seizure [facility])    Rx / DC Orders ED Discharge Orders         Ordered    levETIRAcetam (KEPPRA) 750 MG tablet  2 times daily        10/31/20 2118           Benjiman Core, MD 11/01/20 0011

## 2020-11-01 NOTE — Progress Notes (Signed)
LTM EEG hooked up and running - no initial skin breakdown - push button tested - neuro notified.  

## 2020-11-01 NOTE — ED Notes (Signed)
Pt resting aao4, gcs20, nadn, pt vss on ccm, eeg iin place. No complaints at this time. Police at bedside. Side rails up and padded, suction at bedside. NS infusing without difficulty.

## 2020-11-01 NOTE — Consult Note (Signed)
Neurology Consultation  Reason for Consult: seizure like activity Referring Physician: Vanetta Mulders  CC: I had 3 or 4 seizures today in jail.   History is obtained from:ED PA, chart, patient   HPI: Earl [name] is a 24 y.o. male with a PMHx of seizures in childhood, but he is unable to give a hx about that. States he does remember having seizures his "whole life" and taking medications at some point but is unable to tell writer names of meds or what other types of treatments he may have had. He stated he has been out of his mother's house for years and has been incarcerated a lot since early teens. He tells NP today that he missed a dose of Keppra at the jail, but the RNs say he has received each dose.   He states that today, while sitting in "the hole", he noted fights and a lot of noise on the unit and he began to feel cloudy and confused. He got up to go look out. Witnesses/RNs say he had a "tonic clonic" seizure lasting 20 secs. Prior to EMS arrival, he had 3 more seizures lasting 20 secs, 10 secs, and 10 secs. States he bit the inside of his lower lip, but had no incontinence today. When asked how he felt after the seizures, he says he can feel himself waking up and heard the officers talking over him, but he is just slow to realize what is happening around him.   In review of the chart, patient was here on 12/5 after seizure like activity at the jail. 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 He was loaded with Keppra IV and discharged. His UDS was + for amphetamines on that visit. On 10/29/20, he returned to the ED after 3 witnessed "tonic clonic" seizures. It was at that visit that he c/o HA from hitting his head. CT was neg. Neuro was consulted on 12/6. He had a r/p seizure that night. EEG was ordered which showed no seizures or epileptiform discharges. Pt's Keppra was increased to 1gm po bid and he was discharged back to jail on  10/31/20. On 10/31/20 pm, pt returned again to the ED for seizure activity. Received Keppra load 1 gm, Keppra dose increased to 1500mg  po bid, and pt returned to jail.   Today, he returns to the ED after seizures x 4 at the jail. He states he vomited before his first seizure today.    ROS: A 14 point ROS was performed and is negative except as noted in the HPI.  No family history on file.  Social History:   reports that he has been smoking cigarettes. He has never used smokeless tobacco. He reports current alcohol use. He reports that he does not use drugs. Amphetamine + on UDS on 12/5.   Medications No current facility-administered medications for this encounter.  Current Outpatient Medications:  .  acetaminophen (TYLENOL) 325 MG tablet, Take 650 mg by mouth 3 (three) times daily as needed for moderate pain., Disp: , Rfl:  .  levETIRAcetam (KEPPRA) 750 MG tablet, Take 2 tablets (1,500 mg total) by mouth 2 (two) times daily., Disp: 60 tablet, Rfl: 0 .  loperamide (IMODIUM A-D) 2 MG tablet, Take 2 mg by mouth 2 (two) times daily as needed for diarrhea or loose stools., Disp: , Rfl:    Exam: Current vital signs: BP (!) 143/106   Pulse 93  Temp 98.4 F (36.9 C) (Oral)   Resp (!) 26   Ht 6\' 1"  (1.854 m)   Wt 78 kg   SpO2 100%   BMI 22.69 kg/m  Vital signs in last 24 hours: Temp:  [98.4 F (36.9 C)-98.5 F (36.9 C)] 98.4 F (36.9 C) (12/09 1207) Pulse Rate:  [85-100] 93 (12/09 1400) Resp:  [10-26] 26 (12/09 1400) BP: (117-153)/(77-108) 143/106 (12/09 1400) SpO2:  [99 %-100 %] 100 % (12/09 1400) Weight:  [78 kg] 78 kg (12/09 1213)  GENERAL: Awake, alert in NAD HEENT: - Normocephalic and atraumatic, dry mm. Soft area to left temple without noted hematoma.  LUNGS - normal effort CV - RRR ABDOMEN - Soft, nontender Ext: warm, well perfused. No edema. Chains/lock on LEs. Tender to left shoulder and he grimaces when lifting.   NEURO:  Mental Status: AA&Ox3  Language: speech is  intact and fluent. Naming, repetition, and comprehension intact. Cranial Nerves:  II: PERRL  67mm/brisk. visual fields full III, IV, VI: EOMI. Lid raise symmetrical.  V: Face in symmetrical to light touch.  VII: Smile symmetrical. Able to puff cheeks.  VIII:hearing intact to voice.  IX, X: palate elevates symmetrically XI: shoulder shrug 5/5 XII: tongue symmetrical. No fasciculations.   Motor: bulk is normal. Strength is 5/5 to all muscle groups.  Sensation- Intact to light touch bilaterally but inconsistent with patches of areas when sense is decreased. Vibratory sense splits midline to forehead.  Coordination: FTN intact bilaterally, HNS intact. No ataxia.  DTRs: 2+ Gait- deferred  CBC    Component Value Date/Time   WBC 7.3 11/01/2020 1226   RBC 4.98 11/01/2020 1226   HGB 15.3 11/01/2020 1226   HCT 45.0 11/01/2020 1226   PLT 301 11/01/2020 1226   MCV 90.4 11/01/2020 1226   MCH 30.7 11/01/2020 1226   MCHC 34.0 11/01/2020 1226   RDW 12.4 11/01/2020 1226   LYMPHSABS 2.8 11/01/2020 1226   MONOABS 0.8 11/01/2020 1226   EOSABS 0.2 11/01/2020 1226   BASOSABS 0.0 11/01/2020 1226    CMP     Component Value Date/Time   NA 140 11/01/2020 1226   K 4.2 11/01/2020 1226   CL 107 11/01/2020 1226   CO2 23 11/01/2020 1226   GLUCOSE 86 11/01/2020 1226   BUN 10 11/01/2020 1226   CREATININE 1.02 11/01/2020 1226   CALCIUM 8.7 (L) 11/01/2020 1226   PROT 7.1 10/29/2020 1300   ALBUMIN 4.3 10/29/2020 1300   AST 26 10/29/2020 1300   ALT 55 (H) 10/29/2020 1300   ALKPHOS 45 10/29/2020 1300   BILITOT 1.1 10/29/2020 1300   GFRNONAA >60 11/01/2020 1226    Imaging I have reviewed the images obtained: CT head neg for acute issues.   Assessment: 24 yo incarcerated male with a childhood hx of seizures but unable to elaborate except that he took meds. Pt has been seen 12/5, 12/6-12/8, and later on 12/8 in ED for seizures. His Keppra dose has been increased over those days to 1500mg  po bid. He  has been loaded with 1gm Keppra IV today. Report of 4 "tonic clonic" seizures today at jail. RNs state his meds are being given appropriately. EEG from last visit was neg for seizure activity.   Impression:  -Seizure like activity at jail, several episodes over past 4 days. Hx childhood seizures per pt. CT head neg for acute findings. Interesting to note, that his seizures occurred this time while he was banished to the "hole" in jail. Differential also includes  psychogenic seizures.  -Left shoulder discomfort with hx fall.   Recommendations:  -Agree with 1gm IV keppra load. Overnight EEG. Restart Keppra 1000mg  po bid for now. May need to increase depending on seizure activity in the hospital and EEG results.  -Await UDS with hx amphetamine use.  -Await Keppra level.  -Seizure precautions.  -No driving x 6 months. Needs outpt f/up with neurology upon discharge.   NP secure chatted ED PA about the shoulder discomfort. Fall was charted on him at one of his visits this week. Will defer to ED/hospitalists for tests/xrays.   Pt seen by , NP/neuro and later by Dr. Jimmye Norman. Note/plan to be edited by MD as needed.   He complains of some inconsistent numbness on the left side.  He splits midline to vibration and pinprick on the forehead.  I suspect there may be some psychogenic component to his presentation, though with a history of epilepsy and amphetamine use it is certainly possible that he is having increased flurry of seizures.  I would favor continuous EEG to capture the spells, or ensure that he has a least a 48-hour period of seizure freedom on a regimen of Keppra 1 g twice daily.  [name], MD [facility] [phone]  If 7pm- 7am, please page neurology on call as listed in AMION.

## 2020-11-01 NOTE — ED Triage Notes (Signed)
BIB [facility] and [facility] w/ complaints of having multiple seizures. Pt was in cell block when staff noticed he was having seizures. First two witnessed by nursing staff at jail noted to be tonic/clonic in nature and lasting about 20 secs. Pt had 4 seizures prior to EMS arrival. Initial 2 lasting 20 secs and last two lasting 10 secs. 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 While pt was using meth, the pt did not have any seizures. Since the pt has been detoxing, the seizures have started. Pt did have hx of seizures in childhood. Pt seen at [facility] multiple times for same and discharged with Keppra dose increased, but this has not helped with seizure control. Staff did note that the pt did fall down during the seizure and pt is complaining of pain on the L temporal area of head. VSS w/ ems. BP 148/100, SpO2-100% RA, 94 HR. CBG 98.

## 2020-11-02 DIAGNOSIS — G40919 Epilepsy, unspecified, intractable, without status epilepticus: Secondary | ICD-10-CM | POA: Diagnosis not present

## 2020-11-02 LAB — CBC
HCT: 44.1 % (ref 39.0–52.0)
Hemoglobin: 15.1 g/dL (ref 13.0–17.0)
MCH: 30.3 pg (ref 26.0–34.0)
MCHC: 34.2 g/dL (ref 30.0–36.0)
MCV: 88.6 fL (ref 80.0–100.0)
Platelets: 282 10*3/uL (ref 150–400)
RBC: 4.98 MIL/uL (ref 4.22–5.81)
RDW: 12.1 % (ref 11.5–15.5)
WBC: 7.2 10*3/uL (ref 4.0–10.5)
nRBC: 0 % (ref 0.0–0.2)

## 2020-11-02 LAB — COMPREHENSIVE METABOLIC PANEL
ALT: 42 U/L (ref 0–44)
AST: 22 U/L (ref 15–41)
Albumin: 3.4 g/dL — ABNORMAL LOW (ref 3.5–5.0)
Alkaline Phosphatase: 44 U/L (ref 38–126)
Anion gap: 11 (ref 5–15)
BUN: 12 mg/dL (ref 6–20)
CO2: 24 mmol/L (ref 22–32)
Calcium: 8.4 mg/dL — ABNORMAL LOW (ref 8.9–10.3)
Chloride: 104 mmol/L (ref 98–111)
Creatinine, Ser: 1.06 mg/dL (ref 0.61–1.24)
GFR, Estimated: >60 mL/min (ref >60)
Glucose, Bld: 97 mg/dL (ref 70–99)
Potassium: 3.3 mmol/L — ABNORMAL LOW (ref 3.5–5.1)
Sodium: 139 mmol/L (ref 135–145)
Total Bilirubin: 0.8 mg/dL (ref 0.3–1.2)
Total Protein: 6.1 g/dL — ABNORMAL LOW (ref 6.5–8.1)

## 2020-11-02 LAB — LEVETIRACETAM LEVEL: Levetiracetam Lvl: 20.5 ug/mL (ref 10.0–40.0)

## 2020-11-02 LAB — MAGNESIUM: Magnesium: 1.9 mg/dL (ref 1.7–2.4)

## 2020-11-02 MED ORDER — POTASSIUM CHLORIDE 20 MEQ PO PACK
40.0000 meq | PACK | Freq: Once | ORAL | Status: AC
  Administered 2020-11-02: 40 meq via ORAL
  Filled 2020-11-02: qty 2

## 2020-11-02 NOTE — ED Notes (Signed)
Attempted to give report and was told nurse was not answering phone and that they'll have her call me back.

## 2020-11-02 NOTE — Procedures (Addendum)
Patient Name:Earl [name] Epilepsy Attending:Chimere Klingensmith [name] Referring Physician/Provider:Dr Onalee Hua Duration:11/01/2020 1433 to 11/02/2020 1433  Patient history:24 year old male with a history of seizures with the sudden abrupt increase in seizure frequency despite addition of Keppra.EEG to evaluate for seizure  Level of alertness:Awake, asleep  AEDs during EEG study:LEV  Technical aspects: This EEG study was done with scalp electrodes positioned according to the 10-20 International system of electrode placement. Electrical activity was acquired at a sampling rate of 500Hz  and reviewed with a high frequency filter of 70Hz  and a low frequency filter of 1Hz . EEG data were recorded continuously and digitally stored.   Description: The posterior dominant rhythm consists of 10 Hz activity of moderate voltage (25-35 uV) seen predominantly in posterior head regions, symmetric and reactive to eye opening and eye closing.   Sleep was characterized by vertex waves, sleep spindles (12 to 14 Hz), maximal frontocentral region. Hyperventilation and photic stimulation were not performed.   IMPRESSION: This study is within normal limits. No seizures or epileptiform discharges were seen throughout the recording.  Earl [name] 

## 2020-11-02 NOTE — Progress Notes (Signed)
Patient heart rates have been in the 120s to the 130s. MD notified. Will continue to monitor.

## 2020-11-02 NOTE — Progress Notes (Signed)
LTM maint complete - no skin breakdown under:  Fp1 F3 F7  . Studies after move to different room put on server and merged.

## 2020-11-02 NOTE — Progress Notes (Signed)
Neurology Progress Note  S: I'm sleepy. No seizure activity overnight.  O: Current vital signs: BP (!) 130/93   Pulse 81   Temp 97.6 F (36.4 C) (Axillary)   Resp (!) 23   Ht 6\' 1"  (1.854 m)   Wt 78 kg   SpO2 100%   BMI 22.69 kg/m  Vital signs in last 24 hours: Temp:  [97.6 F (36.4 C)-98.4 F (36.9 C)] 97.6 F (36.4 C) (12/10 0815) Pulse Rate:  [69-117] 81 (12/10 0900) Resp:  [11-30] 23 (12/10 0900) BP: (120-154)/(75-129) 130/93 (12/10 0900) SpO2:  [94 %-100 %] 100 % (12/10 0900) Weight:  [78 kg] 78 kg (12/09 1213) GENERAL:Awakens easily to voice. NAD.  HEENT: Normocephalic and atraumatic LUNGS: normal respiratory effort CV: RRR on tele Ext: warm. Today, he can lift both arms over his head without c/o discomfort.   NEURO:  Mental Status:sleepy but oriented. Language: speech is fluent. Comprehension intact.  Cranial Nerves: PERRL 5 mm/brisk. EOMI. Uvula midline. Tongue without fasciculations.  Motor: 5/5 strength. No drifts Bulk/Tone: normal Sensation: Decreased to light touch to left cheek. Rest intact.   Medications  Current Facility-Administered Medications:  .  0.9 %  sodium chloride infusion, 75 mL/hr, Intravenous, Continuous, 02-28-1979, MD, Last Rate: 75 mL/hr at 11/01/20 1559, 75 mL/hr at 11/01/20 1559 .  acetaminophen (TYLENOL) tablet 650 mg, 650 mg, Oral, Q4H PRN **OR** acetaminophen (TYLENOL) suppository 650 mg, 650 mg, Rectal, Q4H PRN, 14/09/21, MD .  docusate sodium (COLACE) capsule 100 mg, 100 mg, Oral, BID, Jonah Blue, MD, 100 mg at 11/02/20 0904 .  enoxaparin (LOVENOX) injection 40 mg, 40 mg, Subcutaneous, Q24H, 14/10/21, MD, 40 mg at 11/01/20 1615 .  levETIRAcetam (KEPPRA) tablet 1,000 mg, 1,000 mg, Oral, BID, Kirby-Graham, 14/09/21, NP, 1,000 mg at 11/02/20 0904 .  LORazepam (ATIVAN) injection 1-2 mg, 1-2 mg, Intravenous, Q2H PRN, 14/10/21, MD, 2 mg at 11/02/20 0620 .  ondansetron (ZOFRAN) tablet 4 mg, 4 mg, Oral, Q6H  PRN **OR** ondansetron (ZOFRAN) injection 4 mg, 4 mg, Intravenous, Q6H PRN, 0621, MD .  polyethylene glycol (MIRALAX / GLYCOLAX) packet 17 g, 17 g, Oral, Daily PRN, Jonah Blue, MD  Current Outpatient Medications:  .  levETIRAcetam (KEPPRA) 750 MG tablet, Take 2 tablets (1,500 mg total) by mouth 2 (two) times daily., Disp: 60 tablet, Rfl: 0 .  cetirizine (ZYRTEC) 10 MG tablet, Take 10 mg by mouth daily., Disp: , Rfl:   Imaging CT head negative for acute findings.   EEG-negative for eliptiform discharges or seizures.    Assessment: 24 yo male with a hx of childhood seizures who presented to [facility] multiple times in the last 5 days for c/o seizure activity at the jail in spite of Keppra. On his trips to ED and/or hospital stays, he has received IV loads of Keppra and his dose has been increased. He has been attached to EEG since yesterday at 1433 and so far the EEG is negative.   Impression: Seizure like activity. Seizures at jail in spite of being on Keppra. 12/5 seizures thought possibly to be secondary to amphetamine withdrawal. UDS neg this visit.  No seizures overnight. Possible psychogenic component.   Recommendations: -continue EEG overnight.  -continue Keppra 1000mg  po bid. Level pending. May need dose adjustment should any further seizure activity be captured here.  -continue seizure precautions.  -will weigh in tomorrow after overnight EEG continues and is resulted. -needs outpt neuro f/up after discharge.  -No driving until cleared by  a neurologist outpt.   Pt seen by Jimmye Norman, MSN, APN-BC/Nurse Practitioner To be seen by MD and note to be edited per MD.   I have seen the patient reviewed the above note, will continue EEG through tomorrow.  If he has not had any seizures, the likelihood that these are epileptic seizures that suddenly abruptly stopped every time he is connected to EEG is extremely low.  With his reported history of seizures and childhood  epilepsy, I think continuing Keppra 1 g twice daily is reasonable.  Ritta Slot, MD [facility] [phone]  If 7pm- 7am, please page neurology on call as listed in AMION.

## 2020-11-02 NOTE — ED Notes (Signed)
Pt anxious, pulling at lines. Pt c/o unable to sleep for days.

## 2020-11-02 NOTE — Progress Notes (Signed)
PROGRESS NOTE    Earl [name]  LJQ:[id number] DOB: [birth year] DOA: 11/01/2020 PCP: Patient, No Pcp Per   Brief Narrative:  Patient is 24 year old male with past medical history of seizure disorder recently hospitalized due to seizure from 12/6-12/8 presented again with recurrent seizure despite being on Keppra.  He had 3 more seizures on 12/8 after hospital discharge and was again discharged.  He had 2 additional seizures and was once again sent into the hospital for further evaluation and management.  History of alcohol, marijuana, tobacco and methamphetamine.  He is currently in jail for 20 days due to probation violation.  Upon arrival to ED: Patient's vital signs: Labile, CT head and cervical spine obtained which came back negative for acute findings.  Neurology consulted recommended continuous EEG monitoring.  Keppra IV 1 g given.  Restarted Keppra 1000 mg p.o. twice daily.  Assessment & Plan:  Recurrent seizures: -History of 2 prior ER visit & 1 admission due to recurrent seizures. -UDS is negative this time.  CT head/cervical spine: Negative for acute findings. -Patient was given Keppra 1 g IV once in the ED.  Continue Keppra 1000 mg p.o. twice daily. -Overnight video EEG: Negative for seizure. -Keppra level is pending.  Neurology recommended no driving for 6 months.  Follow-up outpatient with neurology. -Ativan as needed -Continue with seizure precautions. -Monitor vitals closely  Hypokalemia: Potassium 3.3.  Replenished.  Magnesium level: WNL.  Repeat BMP tomorrow a.m.  Polysubstance abuse: -UDS from 12/5 was positive for methamphetamine.  Negative on this admission.  Tobacco abuse: Counseled about cessation  Incarceration: History of criminal activity.  Currently in jail for 20 days for probation violation.  DVT prophylaxis: Lovenox Code Status: Full code Family Communication: Emergency planning/management officer present at bedside.  Plan of care discussed with patient in length and he  verbalized understanding and agreed with it. Disposition Plan: Likely to jail tomorrow  Consultants:   Neurology  Procedures:  CT head CT cervical spine EEG Antimicrobials:   None  Status is: Observation  Dispo: The patient is from: [place]              Anticipated d/c is to: [place]              Anticipated d/c date is: 1 day              Patient currently is not medically stable to d/c.   Subjective: Patient seen and examined.  Sleepy but arousable and following commands.  Tells me that he is tired.  Denies any complaints  Objective: Vitals:   11/02/20 0900 11/02/20 1002 11/02/20 1037 11/02/20 1303  BP: (!) 130/93 112/82 129/84 137/72  Pulse: 81 (!) 107 99 (!) 110  Resp: (!) 23  20 20   Temp:  97.8 F (36.6 C) 98.1 F (36.7 C) 98.5 F (36.9 C)  TempSrc:  Oral Oral Oral  SpO2: 100% 98% 100% 98%  Weight:      Height:        Intake/Output Summary (Last 24 hours) at 11/02/2020 1405 Last data filed at 11/01/2020 1407 Gross per 24 hour  Intake 100 ml  Output -  Net 100 ml   Filed Weights   11/01/20 1213  Weight: 78 kg    Examination:  General exam: Appears calm and comfortable, sleepy but arousable, on room air, following commands.  EEG leads noted on his head Respiratory system: Clear to auscultation. Respiratory effort normal. Cardiovascular system: S1 & S2 heard, RRR. No JVD, murmurs, rubs,  gallops or clicks. No pedal edema. Gastrointestinal system: Abdomen is nondistended, soft and nontender. No organomegaly or masses felt. Normal bowel sounds heard. Central nervous system: Alert and oriented. No focal neurological deficits. Extremities: Symmetric 5 x 5 power. Skin: No rashes, lesions or ulcers Psychiatry: Judgement and insight appear normal. Mood & affect appropriate.    Data Reviewed: I have personally reviewed following labs and imaging studies  CBC: Recent Labs  Lab 10/30/20 0500 10/30/20 1158 10/31/20 0025 11/01/20 1226 11/02/20 0247  WBC 5.4  6.8 8.1 7.3 7.2  NEUTROABS  --  3.3 3.7 3.5  --   HGB 9.9* 16.0 15.4 15.3 15.1  HCT 28.2* 45.4 42.5 45.0 44.1  MCV 89.5 86.1 86.7 90.4 88.6  PLT 176 301 310 301 282   Basic Metabolic Panel: Recent Labs  Lab 10/28/20 2019 10/29/20 1300 10/30/20 0500 11/01/20 1226 11/02/20 0247 11/02/20 1049  NA 143 141 139 140 139  --   K 3.0* 3.7 3.6 4.2 3.3*  --   CL 114* 106 108 107 104  --   CO2 21* --   GLUCOSE 72 100* 90 86 97  --   BUN --   CREATININE 0.81 1.13 1.04 1.02 1.06  --   CALCIUM 7.1* 8.9 8.5* 8.7* 8.4*  --   MG  --   --   --   --   --  1.9   GFR: Estimated Creatinine Clearance: 116.5 mL/min (by C-G formula based on SCr of 1.06 mg/dL). Liver Function Tests: Recent Labs  Lab 10/28/20 2019 10/29/20 1300 11/02/20 0247  AST ALT 47* 55* 42  ALKPHOS 40 45 44  BILITOT 0.7 1.1 0.8  PROT 5.7* 7.1 6.1*  ALBUMIN 3.3* 4.3 3.4*   No results for input(s): LIPASE, AMYLASE in the last 168 hours. No results for input(s): AMMONIA in the last 168 hours. Coagulation Profile: No results for input(s): INR, PROTIME in the last 168 hours. Cardiac Enzymes: No results for input(s): CKTOTAL, CKMB, CKMBINDEX, TROPONINI in the last 168 hours. BNP (last 3 results) No results for input(s): PROBNP in the last 8760 hours. HbA1C: No results for input(s): HGBA1C in the last 72 hours. CBG: No results for input(s): GLUCAP in the last 168 hours. Lipid Profile: No results for input(s): CHOL, HDL, LDLCALC, TRIG, CHOLHDL, LDLDIRECT in the last 72 hours. Thyroid Function Tests: No results for input(s): TSH, T4TOTAL, FREET4, T3FREE, THYROIDAB in the last 72 hours. Anemia Panel: No results for input(s): VITAMINB12, FOLATE, FERRITIN, TIBC, IRON, RETICCTPCT in the last 72 hours. Sepsis Labs: No results for input(s): PROCALCITON, LATICACIDVEN in the last 168 hours.  Recent Results (from the past 240 hour(s))  Resp Panel by RT-PCR (Flu A&B, Covid) Nasopharyngeal  Swab     Status: None   Collection Time: 10/29/20  4:08 PM   Specimen: Nasopharyngeal Swab; Nasopharyngeal(NP) swabs in vial transport medium  Result Value Ref Range Status   SARS Coronavirus 2 by RT PCR NEGATIVE NEGATIVE Final    Comment: (NOTE) SARS-CoV-2 target nucleic acids are NOT DETECTED.  The SARS-CoV-2 RNA is generally detectable in upper respiratory specimens during the acute phase of infection. The lowest concentration of SARS-CoV-2 viral copies this assay can detect is 138 copies/mL. A negative result does not preclude SARS-Cov-2 infection and should not be used as the sole basis for treatment or other patient management decisions. A negative result may occur with  improper specimen collection/handling,  submission of specimen other than nasopharyngeal swab, presence of viral mutation(s) within the areas targeted by this assay, and inadequate number of viral copies(<138 copies/mL). A negative result must be combined with clinical observations, patient history, and epidemiological information. The expected result is Negative.  Fact Sheet for Patients:  BloggerCourse.com  Fact Sheet for Healthcare Providers:  SeriousBroker.it  This test is no t yet approved or cleared by the [place] FDA and  has been authorized for detection and/or diagnosis of SARS-CoV-2 by FDA under an Emergency Use Authorization (EUA). This EUA will remain  in effect (meaning this test can be used) for the duration of the COVID-19 declaration under Section 564(b)(1) of the Act, 21 U.S.C.section 360bbb-3(b)(1), unless the authorization is terminated  or revoked sooner.       Influenza A by PCR NEGATIVE NEGATIVE Final   Influenza B by PCR NEGATIVE NEGATIVE Final    Comment: (NOTE) The Xpert Xpress SARS-CoV-2/FLU/RSV plus assay is intended as an aid in the diagnosis of influenza from Nasopharyngeal swab specimens and should not be used as a sole  basis for treatment. Nasal washings and aspirates are unacceptable for Xpert Xpress SARS-CoV-2/FLU/RSV testing.  Fact Sheet for Patients: BloggerCourse.com  Fact Sheet for Healthcare Providers: SeriousBroker.it  This test is not yet approved or cleared by the [place] FDA and has been authorized for detection and/or diagnosis of SARS-CoV-2 by FDA under an Emergency Use Authorization (EUA). This EUA will remain in effect (meaning this test can be used) for the duration of the COVID-19 declaration under Section 564(b)(1) of the Act, 21 U.S.C. section 360bbb-3(b)(1), unless the authorization is terminated or revoked.  Performed at [facility], [address], [place], [place] [address]   Resp Panel by RT-PCR (Flu A&B, Covid) Nasopharyngeal Swab     Status: None   Collection Time: 11/01/20  1:55 PM   Specimen: Nasopharyngeal Swab; Nasopharyngeal(NP) swabs in vial transport medium  Result Value Ref Range Status   SARS Coronavirus 2 by RT PCR NEGATIVE NEGATIVE Final    Comment: (NOTE) SARS-CoV-2 target nucleic acids are NOT DETECTED.  The SARS-CoV-2 RNA is generally detectable in upper respiratory specimens during the acute phase of infection. The lowest concentration of SARS-CoV-2 viral copies this assay can detect is 138 copies/mL. A negative result does not preclude SARS-Cov-2 infection and should not be used as the sole basis for treatment or other patient management decisions. A negative result may occur with  improper specimen collection/handling, submission of specimen other than nasopharyngeal swab, presence of viral mutation(s) within the areas targeted by this assay, and inadequate number of viral copies(<138 copies/mL). A negative result must be combined with clinical observations, patient history, and epidemiological information. The expected result is Negative.  Fact Sheet for Patients:   BloggerCourse.com  Fact Sheet for Healthcare Providers:  SeriousBroker.it  This test is no t yet approved or cleared by the [place] FDA and  has been authorized for detection and/or diagnosis of SARS-CoV-2 by FDA under an Emergency Use Authorization (EUA). This EUA will remain  in effect (meaning this test can be used) for the duration of the COVID-19 declaration under Section 564(b)(1) of the Act, 21 U.S.C.section 360bbb-3(b)(1), unless the authorization is terminated  or revoked sooner.       Influenza A by PCR NEGATIVE NEGATIVE Final   Influenza B by PCR NEGATIVE NEGATIVE Final    Comment: (NOTE) The Xpert Xpress SARS-CoV-2/FLU/RSV plus assay is intended as an aid in the diagnosis of  influenza from Nasopharyngeal swab specimens and should not be used as a sole basis for treatment. Nasal washings and aspirates are unacceptable for Xpert Xpress SARS-CoV-2/FLU/RSV testing.  Fact Sheet for Patients: BloggerCourse.com  Fact Sheet for Healthcare Providers: SeriousBroker.it  This test is not yet approved or cleared by the [place] FDA and has been authorized for detection and/or diagnosis of SARS-CoV-2 by FDA under an Emergency Use Authorization (EUA). This EUA will remain in effect (meaning this test can be used) for the duration of the COVID-19 declaration under Section 564(b)(1) of the Act, 21 U.S.C. section 360bbb-3(b)(1), unless the authorization is terminated or revoked.  Performed at [facility], [address], [place], [place] [address]       Radiology Studies: CT Head Wo Contrast  Result Date: 11/01/2020 CLINICAL DATA:  Seizure with trauma to the head.  Headache. EXAM: CT HEAD WITHOUT CONTRAST CT CERVICAL SPINE WITHOUT CONTRAST TECHNIQUE: Multidetector CT imaging of the head and cervical spine was performed following the standard protocol without  intravenous contrast. Multiplanar CT image reconstructions of the cervical spine were also generated. COMPARISON:  10/29/2020. FINDINGS: CT HEAD FINDINGS Brain: The brain shows a normal appearance without evidence of malformation, atrophy, old or acute small or large vessel infarction, mass lesion, hemorrhage, hydrocephalus or extra-axial collection. Vascular: No hyperdense vessel. No evidence of atherosclerotic calcification. Skull: Normal.  No traumatic finding.  No focal bone lesion. Sinuses/Orbits: Subtotal opacification of the right frontal sinus. Other sinuses are clear. Orbits negative. Other: None significant CT CERVICAL SPINE FINDINGS Alignment: Normal Skull base and vertebrae: Normal Soft tissues and spinal canal: Normal Disc levels:  Normal Upper chest: Normal Other: None IMPRESSION: HEAD CT: Normal except for subtotal opacification of the right frontal sinus. CERVICAL SPINE CT: Normal. Electronically Signed   By: Paulina Fusi M.D.   On: 11/01/2020 13:39   CT Cervical Spine Wo Contrast  Result Date: 11/01/2020 CLINICAL DATA:  Seizure with trauma to the head.  Headache. EXAM: CT HEAD WITHOUT CONTRAST CT CERVICAL SPINE WITHOUT CONTRAST TECHNIQUE: Multidetector CT imaging of the head and cervical spine was performed following the standard protocol without intravenous contrast. Multiplanar CT image reconstructions of the cervical spine were also generated. COMPARISON:  10/29/2020. FINDINGS: CT HEAD FINDINGS Brain: The brain shows a normal appearance without evidence of malformation, atrophy, old or acute small or large vessel infarction, mass lesion, hemorrhage, hydrocephalus or extra-axial collection. Vascular: No hyperdense vessel. No evidence of atherosclerotic calcification. Skull: Normal.  No traumatic finding.  No focal bone lesion. Sinuses/Orbits: Subtotal opacification of the right frontal sinus. Other sinuses are clear. Orbits negative. Other: None significant CT CERVICAL SPINE FINDINGS  Alignment: Normal Skull base and vertebrae: Normal Soft tissues and spinal canal: Normal Disc levels:  Normal Upper chest: Normal Other: None IMPRESSION: HEAD CT: Normal except for subtotal opacification of the right frontal sinus. CERVICAL SPINE CT: Normal. Electronically Signed   By: Paulina Fusi M.D.   On: 11/01/2020 13:39   Overnight EEG with video  Result Date: 11/02/2020 Charlsie Quest, MD     11/02/2020  8:56 AM Patient Name:[facility] GLO:[id number] Epilepsy Attending:Priyanka [name] Referring Physician/Provider:Dr Onalee Hua Duration:11/01/2020 1433 to 11/02/2020 0845  Patient history:24 year old male with a history of seizures with the sudden abrupt increase in seizure frequency despite addition of Keppra.EEG to evaluate for seizure  Level of alertness:Awake, asleep  AEDs during EEG study:LEV  Technical aspects: This EEG study was done with scalp electrodes positioned according to the 10-20  International system of electrode placement. Electrical activity was acquired at a sampling rate of 500Hz  and reviewed with a high frequency filter of 70Hz  and a low frequency filter of 1Hz . EEG data were recorded continuously and digitally stored.  Description: The posterior dominant rhythm consists of 10 Hz activity of moderate voltage (25-35 uV) seen predominantly in posterior head regions, symmetric and reactive to eye opening and eye closing.   Sleep was characterized by vertex waves, sleep spindles (12 to 14 Hz), maximal frontocentral region. Hyperventilation and photic stimulation were not performed.   IMPRESSION: This study is within normal limits. No seizures or epileptiform discharges were seen throughout the recording.  Priyanka [name] Yadav   Scheduled Meds: . docusate sodium  100 mg Oral BID  . enoxaparin (LOVENOX) injection  40 mg Subcutaneous Q24H  . levETIRAcetam  1,000 mg Oral BID   Continuous Infusions: . sodium chloride 75 mL/hr (11/01/20 1559)      LOS: 0 days   Time spent: 35 minutes  Aundra Pung Estill Cotta Roan Miklos, MD [facility]  If 7PM-7AM, please contact night-coverage [email or web address] 11/02/2020, 2:05 PM

## 2020-11-03 ENCOUNTER — Observation Stay (HOSPITAL_BASED_OUTPATIENT_CLINIC_OR_DEPARTMENT_OTHER)

## 2020-11-03 DIAGNOSIS — G40919 Epilepsy, unspecified, intractable, without status epilepticus: Secondary | ICD-10-CM | POA: Diagnosis not present

## 2020-11-03 DIAGNOSIS — I480 Paroxysmal atrial fibrillation: Secondary | ICD-10-CM | POA: Diagnosis not present

## 2020-11-03 DIAGNOSIS — R000 Tachycardia, unspecified: Secondary | ICD-10-CM | POA: Diagnosis not present

## 2020-11-03 LAB — BASIC METABOLIC PANEL
Anion gap: 8 (ref 5–15)
BUN: 11 mg/dL (ref 6–20)
CO2: 21 mmol/L — ABNORMAL LOW (ref 22–32)
Calcium: 8.8 mg/dL — ABNORMAL LOW (ref 8.9–10.3)
Chloride: 109 mmol/L (ref 98–111)
Creatinine, Ser: 0.99 mg/dL (ref 0.61–1.24)
GFR, Estimated: >60 mL/min (ref >60)
Glucose, Bld: 110 mg/dL — ABNORMAL HIGH (ref 70–99)
Potassium: 3.7 mmol/L (ref 3.5–5.1)
Sodium: 138 mmol/L (ref 135–145)

## 2020-11-03 LAB — ECHOCARDIOGRAM COMPLETE
Area-P 1/2: 3.91 cm2
Height: 73 in
S' Lateral: 2.9 cm
Weight: 2752 oz

## 2020-11-03 LAB — TSH: TSH: 0.839 u[IU]/mL (ref 0.350–4.500)

## 2020-11-03 LAB — LEVETIRACETAM LEVEL: Levetiracetam Lvl: 21.2 ug/mL (ref 10.0–40.0)

## 2020-11-03 LAB — MAGNESIUM: Magnesium: 1.9 mg/dL (ref 1.7–2.4)

## 2020-11-03 MED ORDER — METOPROLOL TARTRATE 25 MG PO TABS
25.0000 mg | ORAL_TABLET | Freq: Two times a day (BID) | ORAL | Status: DC
  Administered 2020-11-03: 12:00:00 25 mg via ORAL
  Filled 2020-11-03: qty 1

## 2020-11-03 MED ORDER — LEVETIRACETAM 1000 MG PO TABS: 1000.0000 mg | ORAL_TABLET | Freq: Two times a day (BID) | ORAL | 0 refills | Status: DC

## 2020-11-03 MED ORDER — METOPROLOL TARTRATE 25 MG PO TABS: 25.0000 mg | ORAL_TABLET | Freq: Two times a day (BID) | ORAL | 0 refills | Status: DC

## 2020-11-03 MED ORDER — LEVETIRACETAM 1000 MG PO TABS: 1000.0000 mg | ORAL_TABLET | Freq: Two times a day (BID) | ORAL | 0 refills | Status: AC

## 2020-11-03 NOTE — Discharge Summary (Signed)
Physician Discharge Summary  Earl [name] [facility] ZOX:[id number] DOB: 09-25-1996 DOA: 11/01/2020  PCP: Patient, No Pcp Per  Admit date: 11/01/2020 Discharge date: 11/03/2020  Admitted From: [address]  Disposition:  Jail  Recommendations for Outpatient Follow-up:  1. Take Keppra 1 g twice daily  2. Take metoprolol 25 mg twice daily 3. Follow-up with neurology outpatient 4. No driving until cleared by neurology outpatient 5. Avoid smoking, alcohol, polysubstance abuse  Home Health:none Equipment/Devices:none  Discharge Condition:stable  CODE STATUS:full code  Diet recommendation: Regular diet  Brief/Interim Summary: Patient is 24 year old male with past medical history of seizure disorder recently hospitalized due to seizure from 12/6-12/8 presented again with recurrent seizure despite being on Keppra.  He had 3 more seizures on 12/8 after hospital discharge and was again discharged.  He had 2 additional seizures and was once again sent into the hospital for further evaluation and management.  History of alcohol, marijuana, tobacco and methamphetamine.  He is currently in jail for 20 days due to probation violation.  Upon arrival to ED: Patient's vital signs: Labile, CT head and cervical spine obtained which came back negative for acute findings.  Neurology consulted recommended continuous EEG monitoring.  Keppra IV 1 g given.  Restarted Keppra 1000 mg p.o. twice daily.  Recurrent seizures: -UDS is negative this time.  CT head/cervical spine: Negative for acute findings. -Patient was given Keppra 1 g IV once in the ED.   neurology consulted. -Continued Keppra 1000 mg p.o. twice daily. -Overnight video EEG: Negative for seizure. -Keppra level: WNL.  -Appreciate neurology help-recommended  discharge patient on Keppra 1 g twice daily, follow-up with neurology outpatient and no driving until cleared by neurology outpatient.  Sinus tachycardia: Patient's heart rate noted to be in 110s-120s.  EKG  from yesterday shows A. fib and this morning EKG shows sinus tachycardia. -TSH: WNL.  UDS is negative this time. -Order transthoracic echo which came back within normal limits. -Consulted cardiology recommended Lopressor 25 mg twice daily. - Discussed with cardiology Dr. Eden [name] via secure chat-okay to discharge from cardiac standpoint on Lopressor 25 mg twice daily.  Hypokalemia: Replenished.  Resolved.  Magnesium level: WNL  Polysubstance abuse: -UDS from 12/5 was positive for methamphetamine.  Negative on this admission.  Tobacco/alcohol abuse: Counseled about cessation  Incarceration: History of criminal activity.  Currently in jail for 20 days for probation violation.  Discharge Diagnoses:  Recurrent seizures Sinus tachycardia Hypokalemia Polysubstance abuse Tobacco abuse Incarceration  Discharge Instructions  Discharge Instructions    Diet general   Complete by: As directed    Discharge instructions   Complete by: As directed    Take Keppra 1 g twice daily  Take metoprolol 25 mg twice daily Follow-up with neurology outpatient No driving until cleared by neurology outpatient Avoid smoking, alcohol, polysubstance abuse   Increase activity slowly   Complete by: As directed      Allergies as of 11/03/2020      Reactions   Peanut-containing Drug Products Anaphylaxis, Hives      Medication List    TAKE these medications   cetirizine 10 MG tablet Commonly known as: ZYRTEC Take 10 mg by mouth daily.   levETIRAcetam 1000 MG tablet Commonly known as: KEPPRA Take 1 tablet (1,000 mg total) by mouth 2 (two) times daily. What changed:   medication strength  how much to take   metoprolol tartrate 25 MG tablet Commonly known as: LOPRESSOR Take 1 tablet (25 mg total) by mouth 2 (two) times daily.  Allergies  Allergen Reactions  . Peanut-Containing Drug Products Anaphylaxis and Hives     Consultations:  Neurology  Cardiology   Procedures/Studies: DG Lumbar Spine Complete  Result Date: 10/29/2020 CLINICAL DATA:  Seizures, fall EXAM: LUMBAR SPINE - COMPLETE 4+ VIEW COMPARISON:  None. FINDINGS: Lumbar vertebral body heights and alignment are preserved. There is no acute fracture. There is no evidence of pars break. Intervertebral disc spaces are preserved. There is no significant facet hypertrophy. Sacroiliac joints are unremarkable. IMPRESSION: No acute or significant abnormality. Electronically Signed   By: Guadlupe Spanish M.D.   On: 10/29/2020 13:22   DG Shoulder Right  Result Date: 10/28/2020 CLINICAL DATA:  Seizure today. Fell from bed onto a hard floor. Right shoulder pain. EXAM: RIGHT SHOULDER - 2+ VIEW COMPARISON:  None. FINDINGS: There is no evidence of fracture or dislocation. There is no evidence of arthropathy or other focal bone abnormality. Soft tissues are unremarkable. IMPRESSION: Negative. Electronically Signed   By: Amie Portland M.D.   On: 10/28/2020 20:44   CT Head Wo Contrast  Result Date: 11/01/2020 CLINICAL DATA:  Seizure with trauma to the head.  Headache. EXAM: CT HEAD WITHOUT CONTRAST CT CERVICAL SPINE WITHOUT CONTRAST TECHNIQUE: Multidetector CT imaging of the head and cervical spine was performed following the standard protocol without intravenous contrast. Multiplanar CT image reconstructions of the cervical spine were also generated. COMPARISON:  10/29/2020. FINDINGS: CT HEAD FINDINGS Brain: The brain shows a normal appearance without evidence of malformation, atrophy, old or acute small or large vessel infarction, mass lesion, hemorrhage, hydrocephalus or extra-axial collection. Vascular: No hyperdense vessel. No evidence of atherosclerotic calcification. Skull: Normal.  No traumatic finding.  No focal bone lesion. Sinuses/Orbits: Subtotal opacification of the right frontal sinus. Other sinuses are clear. Orbits negative. Other: None significant CT  CERVICAL SPINE FINDINGS Alignment: Normal Skull base and vertebrae: Normal Soft tissues and spinal canal: Normal Disc levels:  Normal Upper chest: Normal Other: None IMPRESSION: HEAD CT: Normal except for subtotal opacification of the right frontal sinus. CERVICAL SPINE CT: Normal. Electronically Signed   By: Paulina Fusi M.D.   On: 11/01/2020 13:39   CT Head Wo Contrast  Result Date: 10/29/2020 CLINICAL DATA:  Three witnessed seizures about 30 seconds each occurring 5-7 minutes apart, was post ictal following seizures but now alert and oriented, single episode urinary incontinence EXAM: CT HEAD WITHOUT CONTRAST CT CERVICAL SPINE WITHOUT CONTRAST TECHNIQUE: Multidetector CT imaging of the head and cervical spine was performed following the standard protocol without intravenous contrast. Multiplanar CT image reconstructions of the cervical spine were also generated. COMPARISON:  10/28/2020 CT head FINDINGS: CT HEAD FINDINGS Brain: Normal ventricular morphology. No midline shift or mass effect. Normal appearance of brain parenchyma. No intracranial hemorrhage, mass lesion, evidence of acute infarction, or extra-axial fluid collection. Vascular: No hyperdense vessels Skull: Intact Sinuses/Orbits: Opacified RIGHT frontal sinus and LEFT sphenoid sinus unchanged. Other: N/A CT CERVICAL SPINE FINDINGS Alignment: Normal Skull base and vertebrae: Osseous mineralization normal. Skull base intact. Vertebral body and disc space heights maintained. Facet alignments normal. No fracture, subluxation, or bone destruction. Soft tissues and spinal canal: Prevertebral soft tissues normal thickness. Large calcification LEFT submandibular gland question sialolith. Disc levels:  No specific abnormalities Upper chest: Lung apices clear Other: N/A IMPRESSION: No acute intracranial abnormalities. 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 No acute cervical spine abnormalities. Large calcification in LEFT submandibular gland  question sialolith. Electronically Signed   By: Ulyses Southward M.D.   On:  10/29/2020 15:09   CT HEAD WO CONTRAST  Result Date: 10/28/2020 CLINICAL DATA:  Unwitnessed seizure, history of seizures, most recently 1 year prior EXAM: CT HEAD WITHOUT CONTRAST TECHNIQUE: Contiguous axial images were obtained from the base of the skull through the vertex without intravenous contrast. COMPARISON:  Maxillofacial CT 11/19/2007 FINDINGS: Brain: No evidence of acute infarction, hemorrhage, hydrocephalus, extra-axial collection, visible mass lesion or mass effect. Vascular: No hyperdense vessel or unexpected calcification. Skull: No calvarial fracture or suspicious osseous lesion. No scalp swelling or hematoma. Sinuses/Orbits: Mural thickening in the paranasal sinuses most pronounced in the right ethmoids, frontal sinus and left sphenoid sinus and lateral recess. No layering air-fluid levels. Mastoid air cells are predominantly clear. Middle ear cavities are clear. Included orbital structures are unremarkable. Other: None IMPRESSION: 1. No acute intracranial findings. 2. Pansinus disease. Electronically Signed   By: Kreg ShropshirePrice  DeHay M.D.   On: 10/28/2020 20:51   CT Cervical Spine Wo Contrast  Result Date: 11/01/2020 CLINICAL DATA:  Seizure with trauma to the head.  Headache. EXAM: CT HEAD WITHOUT CONTRAST CT CERVICAL SPINE WITHOUT CONTRAST TECHNIQUE: Multidetector CT imaging of the head and cervical spine was performed following the standard protocol without intravenous contrast. Multiplanar CT image reconstructions of the cervical spine were also generated. COMPARISON:  10/29/2020. FINDINGS: CT HEAD FINDINGS Brain: The brain shows a normal appearance without evidence of malformation, atrophy, old or acute small or large vessel infarction, mass lesion, hemorrhage, hydrocephalus or extra-axial collection. Vascular: No hyperdense vessel. No evidence of atherosclerotic calcification. Skull: Normal.  No traumatic finding.  No focal  bone lesion. Sinuses/Orbits: Subtotal opacification of the right frontal sinus. Other sinuses are clear. Orbits negative. Other: None significant CT CERVICAL SPINE FINDINGS Alignment: Normal Skull base and vertebrae: Normal Soft tissues and spinal canal: Normal Disc levels:  Normal Upper chest: Normal Other: None IMPRESSION: HEAD CT: Normal except for subtotal opacification of the right frontal sinus. CERVICAL SPINE CT: Normal. Electronically Signed   By: Paulina FusiMark  Shogry M.D.   On: 11/01/2020 13:39   CT Cervical Spine Wo Contrast  Result Date: 10/29/2020 CLINICAL DATA:  Three witnessed seizures about 30 seconds each occurring 5-7 minutes apart, was post ictal following seizures but now alert and oriented, single episode urinary incontinence EXAM: CT HEAD WITHOUT CONTRAST CT CERVICAL SPINE WITHOUT CONTRAST TECHNIQUE: Multidetector CT imaging of the head and cervical spine was performed following the standard protocol without intravenous contrast. Multiplanar CT image reconstructions of the cervical spine were also generated. COMPARISON:  10/28/2020 CT head FINDINGS: CT HEAD FINDINGS Brain: Normal ventricular morphology. No midline shift or mass effect. Normal appearance of brain parenchyma. No intracranial hemorrhage, mass lesion, evidence of acute infarction, or extra-axial fluid collection. Vascular: No hyperdense vessels Skull: Intact Sinuses/Orbits: Opacified RIGHT frontal sinus and LEFT sphenoid sinus unchanged. Other: N/A CT CERVICAL SPINE FINDINGS Alignment: Normal Skull base and vertebrae: Osseous mineralization normal. Skull base intact. Vertebral body and disc space heights maintained. Facet alignments normal. No fracture, subluxation, or bone destruction. Soft tissues and spinal canal: Prevertebral soft tissues normal thickness. Large calcification LEFT submandibular gland question sialolith. Disc levels:  No specific abnormalities Upper chest: Lung apices clear Other: N/A IMPRESSION: No acute  intracranial abnormalities. 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 No acute cervical spine abnormalities. Large calcification in LEFT submandibular gland question sialolith. Electronically Signed   By: Ulyses SouthwardMark  Boles M.D.   On: 10/29/2020 15:09   EEG adult  Result Date: 10/30/2020 Charlsie QuestYadav, Priyanka O, MD  10/30/2020  8:50 PM Patient Name: Earl [name] MRN: [id number] Epilepsy Attending: Charlsie Quest Referring Physician/Provider: Dr Burnadette Pop Date: 10/30/2020 Duration: 22.45 mins Patient history: 24 year old male with a history of seizures with the sudden abrupt increase in seizure frequency despite addition of Keppra. EEG to evaluate for seizure Level of alertness: Awake AEDs during EEG study: LEV Technical aspects: This EEG study was done with scalp electrodes positioned according to the 10-20 International system of electrode placement. Electrical activity was acquired at a sampling rate of 500Hz  and reviewed with a high frequency filter of 70Hz  and a low frequency filter of 1Hz . EEG data were recorded continuously and digitally stored. Description: The posterior dominant rhythm consists of 10 Hz activity of moderate voltage (25-35 uV) seen predominantly in posterior head regions, symmetric and reactive to eye opening and eye closing.  Hyperventilation and photic stimulation were not performed.   IMPRESSION: This study is within normal limits. No seizures or epileptiform discharges were seen throughout the recording. Priyanka [name]   Overnight EEG with video  Result Date: 11/02/2020 Charlsie Quest, MD     11/03/2020  9:00 AM Patient Name:Earl [name] [facility] WUJ:[id number] Epilepsy Attending:Priyanka [name] Referring Physician/Provider:Dr Onalee Hua Duration:11/01/2020 1433 to 11/02/2020 1433  Patient history:24 year old male with a history of seizures with the sudden abrupt increase in seizure frequency despite addition of Keppra.EEG to evaluate for seizure   Level of alertness:Awake, asleep  AEDs during EEG study:LEV  Technical aspects: This EEG study was done with scalp electrodes positioned according to the 10-20 International system of electrode placement. Electrical activity was acquired at a sampling rate of 500Hz  and reviewed with a high frequency filter of 70Hz  and a low frequency filter of 1Hz . EEG data were recorded continuously and digitally stored.  Description: The posterior dominant rhythm consists of 10 Hz activity of moderate voltage (25-35 uV) seen predominantly in posterior head regions, symmetric and reactive to eye opening and eye closing.   Sleep was characterized by vertex waves, sleep spindles (12 to 14 Hz), maximal frontocentral region. Hyperventilation and photic stimulation were not performed.   IMPRESSION: This study is within normal limits. No seizures or epileptiform discharges were seen throughout the recording.  Priyanka [name]  Overnight EEG with video  Result Date: 10/31/2020 Charlsie Quest, MD     10/31/2020  2:53 PM Patient Name: Earl [name] MRN: [id number] Epilepsy Attending: Charlsie Quest Referring Physician/Provider: Dr Onalee Hua Duration: 10/30/2020 1744 to 10/31/2020 1322  Patient history: 24 year old male with a history of seizures with the sudden abrupt increase in seizure frequency despite addition of Keppra. EEG to evaluate for seizure  Level of alertness: Awake, asleep  AEDs during EEG study: LEV  Technical aspects: This EEG study was done with scalp electrodes positioned according to the 10-20 International system of electrode placement. Electrical activity was acquired at a sampling rate of 500Hz  and reviewed with a high frequency filter of 70Hz  and a low frequency filter of 1Hz . EEG data were recorded continuously and digitally stored.  Description: The posterior dominant rhythm consists of 10 Hz activity of moderate voltage (25-35 uV) seen predominantly in posterior head regions,  symmetric and reactive to eye opening and eye closing.    Sleep was characterized by vertex waves, sleep spindles (12 to 14 Hz), maximal frontocentral region. Hyperventilation and photic stimulation were not performed.    IMPRESSION: This study is within normal limits. No seizures or epileptiform discharges were seen throughout the recording.  Charlsie Quest   ECHOCARDIOGRAM COMPLETE  Result Date: 11/03/2020    ECHOCARDIOGRAM REPORT   Patient Name:   Earl [name] Date of Exam: 11/03/2020 Medical Rec #:  [id number]       Height:       73.0 in Accession #:    [id number]      Weight:       172.0 lb Date of Birth:  1996-05-05       BSA:          2.018 m Patient Age:    24 years        BP:           137/75 mmHg Patient Gender: M               HR:           105 bpm. Exam Location:  Inpatient Procedure: 2D Echo, Cardiac Doppler and Color Doppler Indications:    I48.0 Paroxysmal atrial fibrillation  History:        Patient has no prior history of Echocardiogram examinations.                 Seizures.  Sonographer:    Elmarie Shiley Dance Referring Phys: [id number] Kasandra Knudsen Lynzy Rawles IMPRESSIONS  1. Left ventricular ejection fraction, by estimation, is 65 to 70%. The left ventricle has normal function. The left ventricle has no regional wall motion abnormalities. Left ventricular diastolic parameters were normal.  2. Right ventricular systolic function is normal. The right ventricular size is normal.  3. The mitral valve is normal in structure. Mild mitral valve regurgitation. No evidence of mitral stenosis.  4. The aortic valve is normal in structure. Aortic valve regurgitation is not visualized. No aortic stenosis is present.  5. The inferior vena cava is normal in size with greater than 50% respiratory variability, suggesting right atrial pressure of 3 mmHg. Conclusion(s)/Recommendation(s): Normal biventricular function without evidence of hemodynamically significant valvular heart disease. FINDINGS  Left Ventricle: Left  ventricular ejection fraction, by estimation, is 65 to 70%. The left ventricle has normal function. The left ventricle has no regional wall motion abnormalities. The left ventricular internal cavity size was normal in size. There is  no left ventricular hypertrophy. Left ventricular diastolic parameters were normal. Right Ventricle: The right ventricular size is normal. No increase in right ventricular wall thickness. Right ventricular systolic function is normal. Left Atrium: Left atrial size was normal in size. Right Atrium: Right atrial size was normal in size. Pericardium: There is no evidence of pericardial effusion. Mitral Valve: The mitral valve is normal in structure. Mild mitral valve regurgitation. No evidence of mitral valve stenosis. Tricuspid Valve: The tricuspid valve is normal in structure. Tricuspid valve regurgitation is mild . No evidence of tricuspid stenosis. Aortic Valve: The aortic valve is normal in structure. Aortic valve regurgitation is not visualized. No aortic stenosis is present. Pulmonic Valve: The pulmonic valve was normal in structure. Pulmonic valve regurgitation is not visualized. No evidence of pulmonic stenosis. Aorta: The aortic root is normal in size and structure. Venous: The inferior vena cava is normal in size with greater than 50% respiratory variability, suggesting right atrial pressure of 3 mmHg. IAS/Shunts: No atrial level shunt detected by color flow Doppler.  LEFT VENTRICLE PLAX 2D LVIDd:         4.00 cm  Diastology LVIDs:         2.90 cm  LV e' medial:    8.59 cm/s LV PW:  1.10 cm  LV E/e' medial:  8.5 LV IVS:        0.80 cm  LV e' lateral:   19.40 cm/s LVOT diam:     2.10 cm  LV E/e' lateral: 3.8 LV SV:         64 LV SV Index:   32 LVOT Area:     3.46 cm  RIGHT VENTRICLE             IVC RV Basal diam:  2.70 cm     IVC diam: 2.00 cm RV S prime:     13.40 cm/s TAPSE (M-mode): 2.1 cm LEFT ATRIUM             Index       RIGHT ATRIUM           Index LA diam:         3.70 cm 1.83 cm/m  RA Area:     12.50 cm LA Vol (A2C):   44.1 ml 21.85 ml/m RA Volume:   28.90 ml  14.32 ml/m LA Vol (A4C):   29.6 ml 14.67 ml/m LA Biplane Vol: 35.5 ml 17.59 ml/m  AORTIC VALVE LVOT Vmax:   104.00 cm/s LVOT Vmean:  68.300 cm/s LVOT VTI:    0.186 m  AORTA Ao Root diam: 3.60 cm Ao Asc diam:  2.60 cm MITRAL VALVE MV Area (PHT): 3.91 cm    SHUNTS MV Decel Time: 194 msec    Systemic VTI:  0.19 m MV E velocity: 73.30 cm/s  Systemic Diam: 2.10 cm MV A velocity: 72.80 cm/s MV E/A ratio:  1.01 Tobias Alexander MD Electronically signed by Tobias Alexander MD Signature Date/Time: 11/03/2020/1:46:46 PM    Final       Subjective:  Patient seen and examined this morning.  Tells me that he feels better and has no new complaints.  Denies chest pain, shortness of breath, palpitation, leg swelling, orthopnea, PND, fever, chills, nausea or vomiting.  No seizures overnight.  Discharge Exam: Vitals:   11/03/20 0727 11/03/20 1012  BP: (!) 142/97 137/79  Pulse: (!) 110 (!) 109  Resp:  18  Temp: 98.4 F (36.9 C) 98.5 F (36.9 C)  SpO2: 98% 97%   Vitals:   11/02/20 1706 11/03/20 0700 11/03/20 0727 11/03/20 1012  BP:   (!) 142/97 137/79  Pulse: (!) 114  (!) 110 (!) 109  Resp:  19  18  Temp:   98.4 F (36.9 C) 98.5 F (36.9 C)  TempSrc:   Oral Oral  SpO2:   98% 97%  Weight:      Height:        General: Pt is alert, awake, not in acute distress, on room air, communicating well Cardiovascular: Tachycardic, S1/S2 +, no rubs, no gallops Respiratory: CTA bilaterally, no wheezing, no rhonchi Abdominal: Soft, NT, ND, bowel sounds + Extremities: no edema, no cyanosis    The results of significant diagnostics from this hospitalization (including imaging, microbiology, ancillary and laboratory) are listed below for reference.     Microbiology: Recent Results (from the past 240 hour(s))  Resp Panel by RT-PCR (Flu A&B, Covid) Nasopharyngeal Swab     Status: None   Collection Time:  10/29/20  4:08 PM   Specimen: Nasopharyngeal Swab; Nasopharyngeal(NP) swabs in vial transport medium  Result Value Ref Range Status   SARS Coronavirus 2 by RT PCR NEGATIVE NEGATIVE Final    Comment: (NOTE) SARS-CoV-2 target nucleic acids are NOT DETECTED.  The SARS-CoV-2 RNA is generally  detectable in upper respiratory specimens during the acute phase of infection. The lowest concentration of SARS-CoV-2 viral copies this assay can detect is 138 copies/mL. A negative result does not preclude SARS-Cov-2 infection and should not be used as the sole basis for treatment or other patient management decisions. A negative result may occur with  improper specimen collection/handling, submission of specimen other than nasopharyngeal swab, presence of viral mutation(s) within the areas targeted by this assay, and inadequate number of viral copies(<138 copies/mL). A negative result must be combined with clinical observations, patient history, and epidemiological information. The expected result is Negative.  Fact Sheet for Patients:  BloggerCourse.com  Fact Sheet for Healthcare Providers:  SeriousBroker.it  This test is no t yet approved or cleared by the [place] FDA and  has been authorized for detection and/or diagnosis of SARS-CoV-2 by FDA under an Emergency Use Authorization (EUA). This EUA will remain  in effect (meaning this test can be used) for the duration of the COVID-19 declaration under Section 564(b)(1) of the Act, 21 U.S.C.section 360bbb-3(b)(1), unless the authorization is terminated  or revoked sooner.       Influenza A by PCR NEGATIVE NEGATIVE Final   Influenza B by PCR NEGATIVE NEGATIVE Final    Comment: (NOTE) The Xpert Xpress SARS-CoV-2/FLU/RSV plus assay is intended as an aid in the diagnosis of influenza from Nasopharyngeal swab specimens and should not be used as a sole basis for treatment. Nasal washings  and aspirates are unacceptable for Xpert Xpress SARS-CoV-2/FLU/RSV testing.  Fact Sheet for Patients: BloggerCourse.com  Fact Sheet for Healthcare Providers: SeriousBroker.it  This test is not yet approved or cleared by the [place] FDA and has been authorized for detection and/or diagnosis of SARS-CoV-2 by FDA under an Emergency Use Authorization (EUA). This EUA will remain in effect (meaning this test can be used) for the duration of the COVID-19 declaration under Section 564(b)(1) of the Act, 21 U.S.C. section 360bbb-3(b)(1), unless the authorization is terminated or revoked.  Performed at [facility], [address], [place], [place] [address]   Resp Panel by RT-PCR (Flu A&B, Covid) Nasopharyngeal Swab     Status: None   Collection Time: 11/01/20  1:55 PM   Specimen: Nasopharyngeal Swab; Nasopharyngeal(NP) swabs in vial transport medium  Result Value Ref Range Status   SARS Coronavirus 2 by RT PCR NEGATIVE NEGATIVE Final    Comment: (NOTE) SARS-CoV-2 target nucleic acids are NOT DETECTED.  The SARS-CoV-2 RNA is generally detectable in upper respiratory specimens during the acute phase of infection. The lowest concentration of SARS-CoV-2 viral copies this assay can detect is 138 copies/mL. A negative result does not preclude SARS-Cov-2 infection and should not be used as the sole basis for treatment or other patient management decisions. A negative result may occur with  improper specimen collection/handling, submission of specimen other than nasopharyngeal swab, presence of viral mutation(s) within the areas targeted by this assay, and inadequate number of viral copies(<138 copies/mL). A negative result must be combined with clinical observations, patient history, and epidemiological information. The expected result is Negative.  Fact Sheet for Patients:   BloggerCourse.com  Fact Sheet for Healthcare Providers:  SeriousBroker.it  This test is no t yet approved or cleared by the [place] FDA and  has been authorized for detection and/or diagnosis of SARS-CoV-2 by FDA under an Emergency Use Authorization (EUA). This EUA will remain  in effect (meaning this test can be used) for the duration of the COVID-19 declaration under  Section 564(b)(1) of the Act, 21 U.S.C.section 360bbb-3(b)(1), unless the authorization is terminated  or revoked sooner.       Influenza A by PCR NEGATIVE NEGATIVE Final   Influenza B by PCR NEGATIVE NEGATIVE Final    Comment: (NOTE) The Xpert Xpress SARS-CoV-2/FLU/RSV plus assay is intended as an aid in the diagnosis of influenza from Nasopharyngeal swab specimens and should not be used as a sole basis for treatment. Nasal washings and aspirates are unacceptable for Xpert Xpress SARS-CoV-2/FLU/RSV testing.  Fact Sheet for Patients: BloggerCourse.com  Fact Sheet for Healthcare Providers: SeriousBroker.it  This test is not yet approved or cleared by the [place] FDA and has been authorized for detection and/or diagnosis of SARS-CoV-2 by FDA under an Emergency Use Authorization (EUA). This EUA will remain in effect (meaning this test can be used) for the duration of the COVID-19 declaration under Section 564(b)(1) of the Act, 21 U.S.C. section 360bbb-3(b)(1), unless the authorization is terminated or revoked.  Performed at [facility], [address], [place], [place] [address]      Labs: BNP (last 3 results) No results for input(s): BNP in the last 8760 hours. Basic Metabolic Panel: Recent Labs  Lab 10/29/20 1300 10/30/20 0500 11/01/20 1226 11/02/20 0247 11/02/20 1049 11/03/20 0014  NA 141 139 140 139  --  138  K 3.7 3.6 4.2 3.3*  --  3.7  CL 106 108 107 104  --  109  CO2 25 22  23 24   --  21*  GLUCOSE 100* 90 86 97  --  110*  BUN 10 12 10 12   --  11  CREATININE 1.13 1.04 1.02 1.06  --  0.99  CALCIUM 8.9 8.5* 8.7* 8.4*  --  8.8*  MG  --   --   --   --  1.9 1.9   Liver Function Tests: Recent Labs  Lab 10/28/20 2019 10/29/20 1300 11/02/20 0247  AST 19 26 22   ALT 47* 55* 42  ALKPHOS 40 45 44  BILITOT 0.7 1.1 0.8  PROT 5.7* 7.1 6.1*  ALBUMIN 3.3* 4.3 3.4*   No results for input(s): LIPASE, AMYLASE in the last 168 hours. No results for input(s): AMMONIA in the last 168 hours. CBC: Recent Labs  Lab 10/30/20 0500 10/30/20 1158 10/31/20 0025 11/01/20 1226 11/02/20 0247  WBC 5.4 6.8 8.1 7.3 7.2  NEUTROABS  --  3.3 3.7 3.5  --   HGB 9.9* 16.0 15.4 15.3 15.1  HCT 28.2* 45.4 42.5 45.0 44.1  MCV 89.5 86.1 86.7 90.4 88.6  PLT 176 301 310 301 282   Cardiac Enzymes: No results for input(s): CKTOTAL, CKMB, CKMBINDEX, TROPONINI in the last 168 hours. BNP: Invalid input(s): POCBNP CBG: No results for input(s): GLUCAP in the last 168 hours. D-Dimer No results for input(s): DDIMER in the last 72 hours. Hgb A1c No results for input(s): HGBA1C in the last 72 hours. Lipid Profile No results for input(s): CHOL, HDL, LDLCALC, TRIG, CHOLHDL, LDLDIRECT in the last 72 hours. Thyroid function studies Recent Labs    11/03/20 0817  TSH 0.839   Anemia work up No results for input(s): VITAMINB12, FOLATE, FERRITIN, TIBC, IRON, RETICCTPCT in the last 72 hours. Urinalysis    Component Value Date/Time   COLORURINE YELLOW 10/25/2017 0723   APPEARANCEUR CLEAR 10/25/2017 0723   LABSPEC 1.020 10/25/2017 0723   PHURINE 7.0 10/25/2017 0723   GLUCOSEU NEGATIVE 10/25/2017 0723   HGBUR TRACE (A) 10/25/2017 0723   BILIRUBINUR NEGATIVE 10/25/2017  1610   KETONESUR NEGATIVE 10/25/2017 0723   PROTEINUR NEGATIVE 10/25/2017 0723   NITRITE NEGATIVE 10/25/2017 0723   LEUKOCYTESUR SMALL (A) 10/25/2017 0723   Sepsis Labs Invalid input(s): PROCALCITONIN,  WBC,   LACTICIDVEN Microbiology Recent Results (from the past 240 hour(s))  Resp Panel by RT-PCR (Flu A&B, Covid) Nasopharyngeal Swab     Status: None   Collection Time: 10/29/20  4:08 PM   Specimen: Nasopharyngeal Swab; Nasopharyngeal(NP) swabs in vial transport medium  Result Value Ref Range Status   SARS Coronavirus 2 by RT PCR NEGATIVE NEGATIVE Final    Comment: (NOTE) SARS-CoV-2 target nucleic acids are NOT DETECTED.  The SARS-CoV-2 RNA is generally detectable in upper respiratory specimens during the acute phase of infection. The lowest concentration of SARS-CoV-2 viral copies this assay can detect is 138 copies/mL. A negative result does not preclude SARS-Cov-2 infection and should not be used as the sole basis for treatment or other patient management decisions. A negative result may occur with  improper specimen collection/handling, submission of specimen other than nasopharyngeal swab, presence of viral mutation(s) within the areas targeted by this assay, and inadequate number of viral copies(<138 copies/mL). A negative result must be combined with clinical observations, patient history, and epidemiological information. The expected result is Negative.  Fact Sheet for Patients:  BloggerCourse.com  Fact Sheet for Healthcare Providers:  SeriousBroker.it  This test is no t yet approved or cleared by the [place] FDA and  has been authorized for detection and/or diagnosis of SARS-CoV-2 by FDA under an Emergency Use Authorization (EUA). This EUA will remain  in effect (meaning this test can be used) for the duration of the COVID-19 declaration under Section 564(b)(1) of the Act, 21 U.S.C.section 360bbb-3(b)(1), unless the authorization is terminated  or revoked sooner.       Influenza A by PCR NEGATIVE NEGATIVE Final   Influenza B by PCR NEGATIVE NEGATIVE Final    Comment: (NOTE) The Xpert Xpress SARS-CoV-2/FLU/RSV plus  assay is intended as an aid in the diagnosis of influenza from Nasopharyngeal swab specimens and should not be used as a sole basis for treatment. Nasal washings and aspirates are unacceptable for Xpert Xpress SARS-CoV-2/FLU/RSV testing.  Fact Sheet for Patients: BloggerCourse.com  Fact Sheet for Healthcare Providers: SeriousBroker.it  This test is not yet approved or cleared by the [place] FDA and has been authorized for detection and/or diagnosis of SARS-CoV-2 by FDA under an Emergency Use Authorization (EUA). This EUA will remain in effect (meaning this test can be used) for the duration of the COVID-19 declaration under Section 564(b)(1) of the Act, 21 U.S.C. section 360bbb-3(b)(1), unless the authorization is terminated or revoked.  Performed at [facility], [address], [place], [place] [address]   Resp Panel by RT-PCR (Flu A&B, Covid) Nasopharyngeal Swab     Status: None   Collection Time: 11/01/20  1:55 PM   Specimen: Nasopharyngeal Swab; Nasopharyngeal(NP) swabs in vial transport medium  Result Value Ref Range Status   SARS Coronavirus 2 by RT PCR NEGATIVE NEGATIVE Final    Comment: (NOTE) SARS-CoV-2 target nucleic acids are NOT DETECTED.  The SARS-CoV-2 RNA is generally detectable in upper respiratory specimens during the acute phase of infection. The lowest concentration of SARS-CoV-2 viral copies this assay can detect is 138 copies/mL. A negative result does not preclude SARS-Cov-2 infection and should not be used as the sole basis for treatment or other patient management decisions. A negative result may occur with  improper specimen  collection/handling, submission of specimen other than nasopharyngeal swab, presence of viral mutation(s) within the areas targeted by this assay, and inadequate number of viral copies(<138 copies/mL). A negative result must be combined with clinical  observations, patient history, and epidemiological information. The expected result is Negative.  Fact Sheet for Patients:  BloggerCourse.com  Fact Sheet for Healthcare Providers:  SeriousBroker.it  This test is no t yet approved or cleared by the [place] FDA and  has been authorized for detection and/or diagnosis of SARS-CoV-2 by FDA under an Emergency Use Authorization (EUA). This EUA will remain  in effect (meaning this test can be used) for the duration of the COVID-19 declaration under Section 564(b)(1) of the Act, 21 U.S.C.section 360bbb-3(b)(1), unless the authorization is terminated  or revoked sooner.       Influenza A by PCR NEGATIVE NEGATIVE Final   Influenza B by PCR NEGATIVE NEGATIVE Final    Comment: (NOTE) The Xpert Xpress SARS-CoV-2/FLU/RSV plus assay is intended as an aid in the diagnosis of influenza from Nasopharyngeal swab specimens and should not be used as a sole basis for treatment. Nasal washings and aspirates are unacceptable for Xpert Xpress SARS-CoV-2/FLU/RSV testing.  Fact Sheet for Patients: BloggerCourse.com  Fact Sheet for Healthcare Providers: SeriousBroker.it  This test is not yet approved or cleared by the [place] FDA and has been authorized for detection and/or diagnosis of SARS-CoV-2 by FDA under an Emergency Use Authorization (EUA). This EUA will remain in effect (meaning this test can be used) for the duration of the COVID-19 declaration under Section 564(b)(1) of the Act, 21 U.S.C. section 360bbb-3(b)(1), unless the authorization is terminated or revoked.  Performed at [facility], [address] [place], [place] [address]      Time coordinating discharge: Over 30 minutes  SIGNED:   Ollen Bowl, MD  [facility] 11/03/2020, 2:57 PM Pager   If 7PM-7AM, please contact  night-coverage [email or web address]

## 2020-11-03 NOTE — Progress Notes (Signed)
PROGRESS NOTE    Earl [name]  PIR:[id number] DOB: 1996/01/29 DOA: 11/01/2020 PCP: Patient, No Pcp Per   Brief Narrative:  Patient is 24 year old male with past medical history of seizure disorder recently hospitalized due to seizure from 12/6-12/8 presented again with recurrent seizure despite being on Keppra.  He had 3 more seizures on 12/8 after hospital discharge and was again discharged.  He had 2 additional seizures and was once again sent into the hospital for further evaluation and management.  History of alcohol, marijuana, tobacco and methamphetamine.  He is currently in jail for 20 days due to probation violation.  Upon arrival to ED: Patient's vital signs: Labile, CT head and cervical spine obtained which came back negative for acute findings.  Neurology consulted recommended continuous EEG monitoring.  Keppra IV 1 g given.  Restarted Keppra 1000 mg p.o. twice daily.  Assessment & Plan:  Recurrent seizures: -History of 2 prior ER visit & 1 admission due to recurrent seizures. -UDS is negative this time.  CT head/cervical spine: Negative for acute findings. -Patient was given Keppra 1 g IV once in the ED.   -Continue Keppra 1000 mg p.o. twice daily. -Overnight video EEG: Negative for seizure. -Keppra level: WNL.  -Appreciate neurology help-recommended DC on Keppra 1 g twice daily, follow-up with neurology outpatient and no driving until cleared by neurology outpatient. -Ativan as needed -Continue with seizure precautions. -Monitor vitals closely  Sinus tachycardia: Patient's heart rate noted to be in 110s-120s.  EKG from yesterday shows A. fib and this morning EKG shows sinus tachycardia. -TSH: WNL.  UDS is negative this time. -Order transthoracic echo is pending. -Consulted cardiology recommended Lopressor 25 mg twice daily-appreciate help  Hypokalemia: Replenished.  Resolved.  Magnesium level: WNL  Polysubstance abuse: -UDS from 12/5 was positive for  methamphetamine.  Negative on this admission.  Tobacco abuse: Counseled about cessation  Incarceration: History of criminal activity.  Currently in jail for 20 days for probation violation.  Disposition: Can be discharged to jail today if echo comes back within normal limit.  DVT prophylaxis: Lovenox Code Status: Full code Family Communication: Emergency planning/management officer present at bedside.  Plan of care discussed with patient in length and he verbalized understanding and agreed with it. Disposition Plan: Likely to jail tomorrow  Consultants:   Neurology  Cardiology  Procedures:  CT head CT cervical spine EEG Antimicrobials:   None  Status is: Observation  Dispo: The patient is from: [place]              Anticipated d/c is to: [place]              Anticipated d/c date is: 1 day              Patient currently is not medically stable to d/c.   Subjective: Patient seen and examined.  Resting comfortably on the bed.  Tells me that he feels much better this morning.  No overnight seizure-like activity.  Denies chest pain, shortness of breath, palpitation, orthopnea, PND, fever, chills, anxiety however he tells me that he is stressed out overall.  Objective: Vitals:   11/02/20 1706 11/03/20 0700 11/03/20 0727 11/03/20 1012  BP:   (!) 142/97 137/79  Pulse: (!) 114  (!) 110 (!) 109  Resp:  19  18  Temp:   98.4 F (36.9 C) 98.5 F (36.9 C)  TempSrc:   Oral Oral  SpO2:   98% 97%  Weight:      Height:  Intake/Output Summary (Last 24 hours) at 11/03/2020 1146 Last data filed at 11/03/2020 1014 Gross per 24 hour  Intake 560 ml  Output 1100 ml  Net -540 ml   Filed Weights   11/01/20 1213  Weight: 78 kg    Examination: General exam: Appears calm and comfortable, on room air, communicating well, EEG leads noted on his head Respiratory system: Clear to auscultation. Respiratory effort normal. Cardiovascular system: Tachycardic, no JVD, murmurs, rubs, gallops or clicks. No pedal  edema. Gastrointestinal system: Abdomen is nondistended, soft and nontender. No organomegaly or masses felt. Normal bowel sounds heard. Central nervous system: Alert and oriented. No focal neurological deficits. Extremities: Symmetric 5 x 5 power. Skin: No rashes, lesions or ulcers. Psychiatry: Judgement and insight appear normal. Mood & affect appropriate.   Data Reviewed: I have personally reviewed following labs and imaging studies  CBC: Recent Labs  Lab 10/30/20 0500 10/30/20 1158 10/31/20 0025 11/01/20 1226 11/02/20 0247  WBC 5.4 6.8 8.1 7.3 7.2  NEUTROABS  --  3.3 3.7 3.5  --   HGB 9.9* 16.0 15.4 15.3 15.1  HCT 28.2* 45.4 42.5 45.0 44.1  MCV 89.5 86.1 86.7 90.4 88.6  PLT 176 301 310 301 282   Basic Metabolic Panel: Recent Labs  Lab 10/29/20 1300 10/30/20 0500 11/01/20 1226 11/02/20 0247 11/02/20 1049 11/03/20 0014  NA 141 139 140 139  --  138  K 3.7 3.6 4.2 3.3*  --  3.7  CL 106 108 107 104  --  109  CO2 --  21*  GLUCOSE 100* 90 86 97  --  110*  BUN --  11  CREATININE 1.13 1.04 1.02 1.06  --  0.99  CALCIUM 8.9 8.5* 8.7* 8.4*  --  8.8*  MG  --   --   --   --  1.9 1.9   GFR: Estimated Creatinine Clearance: 124.7 mL/min (by C-G formula based on SCr of 0.99 mg/dL). Liver Function Tests: Recent Labs  Lab 10/28/20 2019 10/29/20 1300 11/02/20 0247  AST ALT 47* 55* 42  ALKPHOS 40 45 44  BILITOT 0.7 1.1 0.8  PROT 5.7* 7.1 6.1*  ALBUMIN 3.3* 4.3 3.4*   No results for input(s): LIPASE, AMYLASE in the last 168 hours. No results for input(s): AMMONIA in the last 168 hours. Coagulation Profile: No results for input(s): INR, PROTIME in the last 168 hours. Cardiac Enzymes: No results for input(s): CKTOTAL, CKMB, CKMBINDEX, TROPONINI in the last 168 hours. BNP (last 3 results) No results for input(s): PROBNP in the last 8760 hours. HbA1C: No results for input(s): HGBA1C in the last 72 hours. CBG: No results for input(s):  GLUCAP in the last 168 hours. Lipid Profile: No results for input(s): CHOL, HDL, LDLCALC, TRIG, CHOLHDL, LDLDIRECT in the last 72 hours. Thyroid Function Tests: Recent Labs    11/03/20 0817  TSH 0.839   Anemia Panel: No results for input(s): VITAMINB12, FOLATE, FERRITIN, TIBC, IRON, RETICCTPCT in the last 72 hours. Sepsis Labs: No results for input(s): PROCALCITON, LATICACIDVEN in the last 168 hours.  Recent Results (from the past 240 hour(s))  Resp Panel by RT-PCR (Flu A&B, Covid) Nasopharyngeal Swab     Status: None   Collection Time: 10/29/20  4:08 PM   Specimen: Nasopharyngeal Swab; Nasopharyngeal(NP) swabs in vial transport medium  Result Value Ref Range Status   SARS Coronavirus 2 by RT PCR NEGATIVE NEGATIVE Final    Comment: (  NOTE) SARS-CoV-2 target nucleic acids are NOT DETECTED.  The SARS-CoV-2 RNA is generally detectable in upper respiratory specimens during the acute phase of infection. The lowest concentration of SARS-CoV-2 viral copies this assay can detect is 138 copies/mL. A negative result does not preclude SARS-Cov-2 infection and should not be used as the sole basis for treatment or other patient management decisions. A negative result may occur with  improper specimen collection/handling, submission of specimen other than nasopharyngeal swab, presence of viral mutation(s) within the areas targeted by this assay, and inadequate number of viral copies(<138 copies/mL). A negative result must be combined with clinical observations, patient history, and epidemiological information. The expected result is Negative.  Fact Sheet for Patients:  BloggerCourse.com  Fact Sheet for Healthcare Providers:  SeriousBroker.it  This test is no t yet approved or cleared by the [place] FDA and  has been authorized for detection and/or diagnosis of SARS-CoV-2 by FDA under an Emergency Use Authorization (EUA). This EUA  will remain  in effect (meaning this test can be used) for the duration of the COVID-19 declaration under Section 564(b)(1) of the Act, 21 U.S.C.section 360bbb-3(b)(1), unless the authorization is terminated  or revoked sooner.       Influenza A by PCR NEGATIVE NEGATIVE Final   Influenza B by PCR NEGATIVE NEGATIVE Final    Comment: (NOTE) The Xpert Xpress SARS-CoV-2/FLU/RSV plus assay is intended as an aid in the diagnosis of influenza from Nasopharyngeal swab specimens and should not be used as a sole basis for treatment. Nasal washings and aspirates are unacceptable for Xpert Xpress SARS-CoV-2/FLU/RSV testing.  Fact Sheet for Patients: BloggerCourse.com  Fact Sheet for Healthcare Providers: SeriousBroker.it  This test is not yet approved or cleared by the [place] FDA and has been authorized for detection and/or diagnosis of SARS-CoV-2 by FDA under an Emergency Use Authorization (EUA). This EUA will remain in effect (meaning this test can be used) for the duration of the COVID-19 declaration under Section 564(b)(1) of the Act, 21 U.S.C. section 360bbb-3(b)(1), unless the authorization is terminated or revoked.  Performed at [facility], [address], [place], [place] [address]   Resp Panel by RT-PCR (Flu A&B, Covid) Nasopharyngeal Swab     Status: None   Collection Time: 11/01/20  1:55 PM   Specimen: Nasopharyngeal Swab; Nasopharyngeal(NP) swabs in vial transport medium  Result Value Ref Range Status   SARS Coronavirus 2 by RT PCR NEGATIVE NEGATIVE Final    Comment: (NOTE) SARS-CoV-2 target nucleic acids are NOT DETECTED.  The SARS-CoV-2 RNA is generally detectable in upper respiratory specimens during the acute phase of infection. The lowest concentration of SARS-CoV-2 viral copies this assay can detect is 138 copies/mL. A negative result does not preclude SARS-Cov-2 infection and should not  be used as the sole basis for treatment or other patient management decisions. A negative result may occur with  improper specimen collection/handling, submission of specimen other than nasopharyngeal swab, presence of viral mutation(s) within the areas targeted by this assay, and inadequate number of viral copies(<138 copies/mL). A negative result must be combined with clinical observations, patient history, and epidemiological information. The expected result is Negative.  Fact Sheet for Patients:  BloggerCourse.com  Fact Sheet for Healthcare Providers:  SeriousBroker.it  This test is no t yet approved or cleared by the [place] FDA and  has been authorized for detection and/or diagnosis of SARS-CoV-2 by FDA under an Emergency Use Authorization (EUA). This EUA will remain  in effect (  meaning this test can be used) for the duration of the COVID-19 declaration under Section 564(b)(1) of the Act, 21 U.S.C.section 360bbb-3(b)(1), unless the authorization is terminated  or revoked sooner.       Influenza A by PCR NEGATIVE NEGATIVE Final   Influenza B by PCR NEGATIVE NEGATIVE Final    Comment: (NOTE) The Xpert Xpress SARS-CoV-2/FLU/RSV plus assay is intended as an aid in the diagnosis of influenza from Nasopharyngeal swab specimens and should not be used as a sole basis for treatment. Nasal washings and aspirates are unacceptable for Xpert Xpress SARS-CoV-2/FLU/RSV testing.  Fact Sheet for Patients: BloggerCourse.com  Fact Sheet for Healthcare Providers: SeriousBroker.it  This test is not yet approved or cleared by the [place] FDA and has been authorized for detection and/or diagnosis of SARS-CoV-2 by FDA under an Emergency Use Authorization (EUA). This EUA will remain in effect (meaning this test can be used) for the duration of the COVID-19 declaration under Section  564(b)(1) of the Act, 21 U.S.C. section 360bbb-3(b)(1), unless the authorization is terminated or revoked.  Performed at [facility], [address] [place], [place] [address]       Radiology Studies: CT Head Wo Contrast  Result Date: 11/01/2020 CLINICAL DATA:  Seizure with trauma to the head.  Headache. EXAM: CT HEAD WITHOUT CONTRAST CT CERVICAL SPINE WITHOUT CONTRAST TECHNIQUE: Multidetector CT imaging of the head and cervical spine was performed following the standard protocol without intravenous contrast. Multiplanar CT image reconstructions of the cervical spine were also generated. COMPARISON:  10/29/2020. FINDINGS: CT HEAD FINDINGS Brain: The brain shows a normal appearance without evidence of malformation, atrophy, old or acute small or large vessel infarction, mass lesion, hemorrhage, hydrocephalus or extra-axial collection. Vascular: No hyperdense vessel. No evidence of atherosclerotic calcification. Skull: Normal.  No traumatic finding.  No focal bone lesion. Sinuses/Orbits: Subtotal opacification of the right frontal sinus. Other sinuses are clear. Orbits negative. Other: None significant CT CERVICAL SPINE FINDINGS Alignment: Normal Skull base and vertebrae: Normal Soft tissues and spinal canal: Normal Disc levels:  Normal Upper chest: Normal Other: None IMPRESSION: HEAD CT: Normal except for subtotal opacification of the right frontal sinus. CERVICAL SPINE CT: Normal. Electronically Signed   By: Paulina Fusi M.D.   On: 11/01/2020 13:39   CT Cervical Spine Wo Contrast  Result Date: 11/01/2020 CLINICAL DATA:  Seizure with trauma to the head.  Headache. EXAM: CT HEAD WITHOUT CONTRAST CT CERVICAL SPINE WITHOUT CONTRAST TECHNIQUE: Multidetector CT imaging of the head and cervical spine was performed following the standard protocol without intravenous contrast. Multiplanar CT image reconstructions of the cervical spine were also generated. COMPARISON:  10/29/2020. FINDINGS: CT HEAD  FINDINGS Brain: The brain shows a normal appearance without evidence of malformation, atrophy, old or acute small or large vessel infarction, mass lesion, hemorrhage, hydrocephalus or extra-axial collection. Vascular: No hyperdense vessel. No evidence of atherosclerotic calcification. Skull: Normal.  No traumatic finding.  No focal bone lesion. Sinuses/Orbits: Subtotal opacification of the right frontal sinus. Other sinuses are clear. Orbits negative. Other: None significant CT CERVICAL SPINE FINDINGS Alignment: Normal Skull base and vertebrae: Normal Soft tissues and spinal canal: Normal Disc levels:  Normal Upper chest: Normal Other: None IMPRESSION: HEAD CT: Normal except for subtotal opacification of the right frontal sinus. CERVICAL SPINE CT: Normal. Electronically Signed   By: Paulina Fusi M.D.   On: 11/01/2020 13:39   Overnight EEG with video  Result Date: 11/02/2020 Charlsie Quest, MD  11/03/2020  9:00 AM [id number] Epilepsy Attending:Priyanka [name] Yadav Referring Physician/Provider:Dr Onalee HuaMcNeil Kirkpatrick Duration:11/01/2020 1433 to 11/02/2020 1433  Patient history:24 year old male with a history of seizures with the sudden abrupt increase in seizure frequency despite addition of Keppra.EEG to evaluate for seizure  Level of alertness:Awake, asleep  AEDs during EEG study:LEV  Technical aspects: This EEG study was done with scalp electrodes positioned according to the 10-20 International system of electrode placement. Electrical activity was acquired at a sampling rate of 500Hz  and reviewed with a high frequency filter of 70Hz  and a low frequency filter of 1Hz . EEG data were recorded continuously and digitally stored.  Description: The posterior dominant rhythm consists of 10 Hz activity of moderate voltage (25-35 uV) seen predominantly in posterior head regions, symmetric and reactive to eye opening and eye closing.   Sleep was characterized by vertex  waves, sleep spindles (12 to 14 Hz), maximal frontocentral region. Hyperventilation and photic stimulation were not performed.   IMPRESSION: This study is within normal limits. No seizures or epileptiform discharges were seen throughout the recording.  Priyanka [name] Yadav   Scheduled Meds: . docusate sodium  100 mg Oral BID  . enoxaparin (LOVENOX) injection  40 mg Subcutaneous Q24H  . levETIRAcetam  1,000 mg Oral BID  . metoprolol tartrate  25 mg Oral BID   Continuous Infusions: . sodium chloride 75 mL/hr (11/01/20 1559)     LOS: 0 days   Time spent: 35 minutes  Karagan Lehr Estill Cotta Priya Matsen, MD [facility]  If 7PM-7AM, please contact night-coverage [email or web address] 11/03/2020, 11:46 AM

## 2020-11-03 NOTE — Consult Note (Signed)
CARDIOLOGY CONSULT NOTE       Patient ID: Earl [name] MRN: [id number] DOB/AGE: [birth year] 26 y.o.  Admit date: 11/01/2020 Referring Physician: Lindie Spruce Primary Physician: Patient, No Pcp Per Primary Cardiologist: None Reason for Consultation: Tachycardia  Principal Problem:   Refractory seizure (HCC) Active Problems:   Amphetamine abuse (HCC)   Polysubstance abuse (HCC)   Tobacco dependence   Incarceration   HPI:  24 y.o. incarcerated 20 days probation. History of amphetamine, ETOH abuse and smoking. Admitted with recurrent seizures now on Keppra. No previous congenital or cardiac history . HR elevated. ECG with sinus One ECG mis read as afib due to artifact Current telemetry ST rate 130 Patient is unaware sleeping with legs chained and EEG on . No chest pain palpitations dyspnea or syncope. TTE is pending TSH and Hct normal on admission Urine drug screen positive for amphetamines 6 days ago   ROS All other systems reviewed and negative except as noted above  Past Medical History:  Diagnosis Date  . Seizures (HCC)     No family history on file.  Social History   Socioeconomic History  . Marital status: Single    Spouse name: Not on file  . Number of children: Not on file  . Years of education: Not on file  . Highest education level: Not on file  Occupational History  . Not on file  Tobacco Use  . Smoking status: Current Every Day Smoker    Types: Cigarettes  . Smokeless tobacco: Never Used  Vaping Use  . Vaping Use: Never used  Substance and Sexual Activity  . Alcohol use: Yes  . Drug use: No  . Sexual activity: Not on file  Other Topics Concern  . Not on file  Social History Narrative  . Not on file   Social Determinants of Health   Financial Resource Strain: Not on file  Food Insecurity: Not on file  Transportation Needs: Not on file  Physical Activity: Not on file  Stress: Not on file  Social Connections: Not on file  Intimate Partner  Violence: Not on file    Past Surgical History:  Procedure Laterality Date  . OPEN REDUCTION INTERNAL FIXATION (ORIF) HAND Left       Current Facility-Administered Medications:  .  0.9 %  sodium chloride infusion, 75 mL/hr, Intravenous, Continuous, Jonah Blue, MD, Last Rate: 75 mL/hr at 11/01/20 1559, 75 mL/hr at 11/01/20 1559 .  acetaminophen (TYLENOL) tablet 650 mg, 650 mg, Oral, Q4H PRN, 650 mg at 11/03/20 1012 **OR** acetaminophen (TYLENOL) suppository 650 mg, 650 mg, Rectal, Q4H PRN, Jonah Blue, MD .  docusate sodium (COLACE) capsule 100 mg, 100 mg, Oral, BID, Jonah Blue, MD, 100 mg at 11/02/20 0904 .  enoxaparin (LOVENOX) injection 40 mg, 40 mg, Subcutaneous, Q24H, Jonah Blue, MD, 40 mg at 11/02/20 1606 .  levETIRAcetam (KEPPRA) tablet 1,000 mg, 1,000 mg, Oral, BID, Kirby-Graham, Beather Arbour, NP, 1,000 mg at 11/03/20 1013 .  LORazepam (ATIVAN) injection 1-2 mg, 1-2 mg, Intravenous, Q2H PRN, Jonah Blue, MD, 2 mg at 11/02/20 2244 .  ondansetron (ZOFRAN) tablet 4 mg, 4 mg, Oral, Q6H PRN **OR** ondansetron (ZOFRAN) injection 4 mg, 4 mg, Intravenous, Q6H PRN, Jonah Blue, MD .  polyethylene glycol (MIRALAX / GLYCOLAX) packet 17 g, 17 g, Oral, Daily PRN, Jonah Blue, MD . docusate sodium  100 mg Oral BID  . enoxaparin (LOVENOX) injection  40 mg Subcutaneous Q24H  . levETIRAcetam  1,000 mg Oral BID   .  sodium chloride 75 mL/hr (11/01/20 1559)    Physical Exam: Blood pressure 137/79, pulse (!) 109, temperature 98.5 F (36.9 C), temperature source Oral, resp. rate 18, height  (1.854 m), weight 78 kg, SpO2 97 %.    Affect appropriate Thin black male  HEENT: normal Neck supple with no adenopathy JVP normal no bruits no thyromegaly Lungs clear with no wheezing and good diaphragmatic motion Heart:  S1/S2 no murmur, no rub, gallop or click PMI normal Abdomen: benighn, BS positve, no tenderness, no AAA no bruit.  No HSM or HJR Distal pulses intact with  no bruits No edema Neuro non-focal Multiple tatoos  No muscular weakness   Labs:   Lab Results  Component Value Date   WBC 7.2 11/02/2020   HGB 15.1 11/02/2020   HCT 44.1 11/02/2020   MCV 88.6 11/02/2020   PLT 282 11/02/2020    Recent Labs  Lab 11/02/20 0247 11/03/20 0014  NA 139 138  K 3.3* 3.7  CL 104 109  CO2 24 21*  BUN 12 11  CREATININE 1.06 0.99  CALCIUM 8.4* 8.8*  PROT 6.1*  --   BILITOT 0.8  --   ALKPHOS 44  --   ALT 42  --   AST 22  --   GLUCOSE 97 110*   No results found for: CKTOTAL, CKMB, CKMBINDEX, TROPONINI No results found for: CHOL No results found for: HDL No results found for: LDLCALC No results found for: TRIG No results found for: CHOLHDL No results found for: LDLDIRECT    Radiology: DG Lumbar Spine Complete  Result Date: 10/29/2020 CLINICAL DATA:  Seizures, fall EXAM: LUMBAR SPINE - COMPLETE 4+ VIEW COMPARISON:  None. FINDINGS: Lumbar vertebral body heights and alignment are preserved. There is no acute fracture. There is no evidence of pars break. Intervertebral disc spaces are preserved. There is no significant facet hypertrophy. Sacroiliac joints are unremarkable. IMPRESSION: No acute or significant abnormality. Electronically Signed   By: Guadlupe Spanish M.D.   On: 10/29/2020 13:22   DG Shoulder Right  Result Date: 10/28/2020 CLINICAL DATA:  Seizure today. Fell from bed onto a hard floor. Right shoulder pain. EXAM: RIGHT SHOULDER - 2+ VIEW COMPARISON:  None. FINDINGS: There is no evidence of fracture or dislocation. There is no evidence of arthropathy or other focal bone abnormality. Soft tissues are unremarkable. IMPRESSION: Negative. Electronically Signed   By: Amie Portland M.D.   On: 10/28/2020 20:44   CT Head Wo Contrast  Result Date: 11/01/2020 CLINICAL DATA:  Seizure with trauma to the head.  Headache. EXAM: CT HEAD WITHOUT CONTRAST CT CERVICAL SPINE WITHOUT CONTRAST TECHNIQUE: Multidetector CT imaging of the head and cervical spine  was performed following the standard protocol without intravenous contrast. Multiplanar CT image reconstructions of the cervical spine were also generated. COMPARISON:  10/29/2020. FINDINGS: CT HEAD FINDINGS Brain: The brain shows a normal appearance without evidence of malformation, atrophy, old or acute small or large vessel infarction, mass lesion, hemorrhage, hydrocephalus or extra-axial collection. Vascular: No hyperdense vessel. No evidence of atherosclerotic calcification. Skull: Normal.  No traumatic finding.  No focal bone lesion. Sinuses/Orbits: Subtotal opacification of the right frontal sinus. Other sinuses are clear. Orbits negative. Other: None significant CT CERVICAL SPINE FINDINGS Alignment: Normal Skull base and vertebrae: Normal Soft tissues and spinal canal: Normal Disc levels:  Normal Upper chest: Normal Other: None IMPRESSION: HEAD CT: Normal except for subtotal opacification of the right frontal sinus. CERVICAL SPINE CT: Normal. Electronically Signed   By:  Paulina Fusi M.D.   On: 11/01/2020 13:39   CT Head Wo Contrast  Result Date: 10/29/2020 CLINICAL DATA:  Three witnessed seizures about 30 seconds each occurring 5-7 minutes apart, was post ictal following seizures but now alert and oriented, single episode urinary incontinence EXAM: CT HEAD WITHOUT CONTRAST CT CERVICAL SPINE WITHOUT CONTRAST TECHNIQUE: Multidetector CT imaging of the head and cervical spine was performed following the standard protocol without intravenous contrast. Multiplanar CT image reconstructions of the cervical spine were also generated. COMPARISON:  10/28/2020 CT head FINDINGS: CT HEAD FINDINGS Brain: Normal ventricular morphology. No midline shift or mass effect. Normal appearance of brain parenchyma. No intracranial hemorrhage, mass lesion, evidence of acute infarction, or extra-axial fluid collection. Vascular: No hyperdense vessels Skull: Intact Sinuses/Orbits: Opacified RIGHT frontal sinus and LEFT sphenoid  sinus unchanged. Other: N/A CT CERVICAL SPINE FINDINGS Alignment: Normal Skull base and vertebrae: Osseous mineralization normal. Skull base intact. Vertebral body and disc space heights maintained. Facet alignments normal. No fracture, subluxation, or bone destruction. Soft tissues and spinal canal: Prevertebral soft tissues normal thickness. Large calcification LEFT submandibular gland question sialolith. Disc levels:  No specific abnormalities Upper chest: Lung apices clear Other: N/A IMPRESSION: No acute intracranial abnormalities. 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 No acute cervical spine abnormalities. Large calcification in LEFT submandibular gland question sialolith. Electronically Signed   By: Ulyses Southward M.D.   On: 10/29/2020 15:09   CT HEAD WO CONTRAST  Result Date: 10/28/2020 CLINICAL DATA:  Unwitnessed seizure, history of seizures, most recently 1 year prior EXAM: CT HEAD WITHOUT CONTRAST TECHNIQUE: Contiguous axial images were obtained from the base of the skull through the vertex without intravenous contrast. COMPARISON:  Maxillofacial CT 11/19/2007 FINDINGS: Brain: No evidence of acute infarction, hemorrhage, hydrocephalus, extra-axial collection, visible mass lesion or mass effect. Vascular: No hyperdense vessel or unexpected calcification. Skull: No calvarial fracture or suspicious osseous lesion. No scalp swelling or hematoma. Sinuses/Orbits: Mural thickening in the paranasal sinuses most pronounced in the right ethmoids, frontal sinus and left sphenoid sinus and lateral recess. No layering air-fluid levels. Mastoid air cells are predominantly clear. Middle ear cavities are clear. Included orbital structures are unremarkable. Other: None IMPRESSION: 1. No acute intracranial findings. 2. Pansinus disease. Electronically Signed   By: Kreg Shropshire M.D.   On: 10/28/2020 20:51   CT Cervical Spine Wo Contrast  Result Date: 11/01/2020 CLINICAL DATA:  Seizure with trauma to the  head.  Headache. EXAM: CT HEAD WITHOUT CONTRAST CT CERVICAL SPINE WITHOUT CONTRAST TECHNIQUE: Multidetector CT imaging of the head and cervical spine was performed following the standard protocol without intravenous contrast. Multiplanar CT image reconstructions of the cervical spine were also generated. COMPARISON:  10/29/2020. FINDINGS: CT HEAD FINDINGS Brain: The brain shows a normal appearance without evidence of malformation, atrophy, old or acute small or large vessel infarction, mass lesion, hemorrhage, hydrocephalus or extra-axial collection. Vascular: No hyperdense vessel. No evidence of atherosclerotic calcification. Skull: Normal.  No traumatic finding.  No focal bone lesion. Sinuses/Orbits: Subtotal opacification of the right frontal sinus. Other sinuses are clear. Orbits negative. Other: None significant CT CERVICAL SPINE FINDINGS Alignment: Normal Skull base and vertebrae: Normal Soft tissues and spinal canal: Normal Disc levels:  Normal Upper chest: Normal Other: None IMPRESSION: HEAD CT: Normal except for subtotal opacification of the right frontal sinus. CERVICAL SPINE CT: Normal. Electronically Signed   By: Paulina Fusi M.D.   On: 11/01/2020 13:39   CT Cervical Spine Wo Contrast  Result  Date: 10/29/2020 CLINICAL DATA:  Three witnessed seizures about 30 seconds each occurring 5-7 minutes apart, was post ictal following seizures but now alert and oriented, single episode urinary incontinence EXAM: CT HEAD WITHOUT CONTRAST CT CERVICAL SPINE WITHOUT CONTRAST TECHNIQUE: Multidetector CT imaging of the head and cervical spine was performed following the standard protocol without intravenous contrast. Multiplanar CT image reconstructions of the cervical spine were also generated. COMPARISON:  10/28/2020 CT head FINDINGS: CT HEAD FINDINGS Brain: Normal ventricular morphology. No midline shift or mass effect. Normal appearance of brain parenchyma. No intracranial hemorrhage, mass lesion, evidence of  acute infarction, or extra-axial fluid collection. Vascular: No hyperdense vessels Skull: Intact Sinuses/Orbits: Opacified RIGHT frontal sinus and LEFT sphenoid sinus unchanged. Other: N/A CT CERVICAL SPINE FINDINGS Alignment: Normal Skull base and vertebrae: Osseous mineralization normal. Skull base intact. Vertebral body and disc space heights maintained. Facet alignments normal. No fracture, subluxation, or bone destruction. Soft tissues and spinal canal: Prevertebral soft tissues normal thickness. Large calcification LEFT submandibular gland question sialolith. Disc levels:  No specific abnormalities Upper chest: Lung apices clear Other: N/A IMPRESSION: No acute intracranial abnormalities. 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 No acute cervical spine abnormalities. Large calcification in LEFT submandibular gland question sialolith. Electronically Signed   By: Ulyses Southward M.D.   On: 10/29/2020 15:09   EEG adult  Result Date: 10/30/2020 Charlsie Quest, MD     10/30/2020  8:50 PM Patient Name: KELTIN BAIRD MRN: [id number] Epilepsy Attending: Charlsie Quest Referring Physician/Provider: Dr Burnadette Pop Date: 10/30/2020 Duration: 22.45 mins Patient history: 24 year old male with a history of seizures with the sudden abrupt increase in seizure frequency despite addition of Keppra. EEG to evaluate for seizure Level of alertness: Awake AEDs during EEG study: LEV Technical aspects: This EEG study was done with scalp electrodes positioned according to the 10-20 International system of electrode placement. Electrical activity was acquired at a sampling rate of 500Hz  and reviewed with a high frequency filter of 70Hz  and a low frequency filter of 1Hz . EEG data were recorded continuously and digitally stored. Description: The posterior dominant rhythm consists of 10 Hz activity of moderate voltage (25-35 uV) seen predominantly in posterior head regions, symmetric and reactive to eye opening and eye  closing.  Hyperventilation and photic stimulation were not performed.   IMPRESSION: This study is within normal limits. No seizures or epileptiform discharges were seen throughout the recording. Priyanka   Overnight EEG with video  Result Date: 11/02/2020 , MD     11/03/2020  9:00 AM Patient Name:[facility] Charlsie Quest Epilepsy Attending:Priyanka 14/09/2020 Referring Physician/Provider:Dr [facility] Duration:11/01/2020 1433 to 11/02/2020 1433  Patient history:24 year old male with a history of seizures with the sudden abrupt increase in seizure frequency despite addition of Keppra.EEG to evaluate for seizure  Level of alertness:Awake, asleep  AEDs during EEG study:LEV  Technical aspects: This EEG study was done with scalp electrodes positioned according to the 10-20 International system of electrode placement. Electrical activity was acquired at a sampling rate of 500Hz  and reviewed with a high frequency filter of 70Hz  and a low frequency filter of 1Hz . EEG data were recorded continuously and digitally stored.  Description: The posterior dominant rhythm consists of 10 Hz activity of moderate voltage (25-35 uV) seen predominantly in posterior head regions, symmetric and reactive to eye opening and eye closing.   Sleep was characterized by vertex waves, sleep spindles (12 to 14 Hz), maximal frontocentral region. Hyperventilation and  photic stimulation were not performed.   IMPRESSION: This study is within normal limits. No seizures or epileptiform discharges were seen throughout the recording.  Priyanka [name] Yadav  Overnight EEG with video  Result Date: 10/31/2020 Charlsie QuestYadav, Priyanka O, MD     10/31/2020  2:53 PM Patient Name: Ollen GrossRonald E Casali MRN: [id number] Epilepsy Attending: Charlsie QuestPriyanka O Yadav Referring Physician/Provider: Dr Onalee HuaMcNeil Kirkpatrick Duration: 10/30/2020 1744 to 10/31/2020 1322  Patient history: 24 year old male with a history of seizures with the  sudden abrupt increase in seizure frequency despite addition of Keppra. EEG to evaluate for seizure  Level of alertness: Awake, asleep  AEDs during EEG study: LEV  Technical aspects: This EEG study was done with scalp electrodes positioned according to the 10-20 International system of electrode placement. Electrical activity was acquired at a sampling rate of 500Hz  and reviewed with a high frequency filter of 70Hz  and a low frequency filter of 1Hz . EEG data were recorded continuously and digitally stored.  Description: The posterior dominant rhythm consists of 10 Hz activity of moderate voltage (25-35 uV) seen predominantly in posterior head regions, symmetric and reactive to eye opening and eye closing.    Sleep was characterized by vertex waves, sleep spindles (12 to 14 Hz), maximal frontocentral region. Hyperventilation and photic stimulation were not performed.    IMPRESSION: This study is within normal limits. No seizures or epileptiform discharges were seen throughout the recording.  Priyanka O Yadav    EKG: SR/ST no pre excitation    ASSESSMENT AND PLAN:   1. Tachycardia:  ECG;s all show NSR one read as afib is artifact. Start lopressor bid TTE to r/o congenital heart disease with normal exam unlikely Amphetamines should be out of system Mitral inflow velocities on echo should confirm if sinus tachycardia. TSH/HCT ok   2. Seizures:  Keppra EEG being done   Signed: Charlton HawsPeter Ashlyne Olenick 11/03/2020, 11:06 AM

## 2020-11-03 NOTE — Procedures (Addendum)
Patient Name:Earl [name] YPP:[id number] Epilepsy Attending:[name] [name] Referring Physician/Provider:DrMcNeil [name] Duration:11/02/2020 1433 to 12/11/20211433  Patient history:24 year old male with a history of seizures with the sudden abrupt increase in seizure frequency despite addition of Keppra.EEG to evaluate for seizure  Level of alertness:Awake, asleep  AEDs during EEG study:LEV  Technical aspects: This EEG study was done with scalp electrodes positioned according to the 10-20 International system of electrode placement. Electrical activity was acquired at a sampling rate of 500Hz  and reviewed with a high frequency filter of 70Hz  and a low frequency filter of 1Hz . EEG data were recorded continuously and digitally stored.   Description: The posterior dominant rhythm consists of 10 Hz activity of moderate voltage (25-35 uV) seen predominantly in posterior head regions, symmetric and reactive to eye opening and eye closing.Sleep was characterized by vertex waves, sleep spindles (12 to 14 Hz),maximal frontocentral region. Hyperventilation and photic stimulation were not performed.   IMPRESSION: This study is within normal limits. No seizures or epileptiform discharges were seen throughout the recording.  [name] 

## 2020-11-03 NOTE — Progress Notes (Signed)
  Echocardiogram 2D Echocardiogram has been performed.  Earl [name] G Earl [name] 11/03/2020, 1:37 PM

## 2020-11-03 NOTE — Progress Notes (Signed)
vLTM check.  Event button tested  No skin breakdown at FP1  FP2  F7   F8

## 2020-11-03 NOTE — TOC Transition Note (Signed)
Transition of Care [facility]) - CM/SW Discharge Note   Patient Details  Name: Earl [name] MRN: [id number] Date of Birth: January 21, 1996  [facility] CM/SW Contact:  Lawerance Sabal, RN Phone Number: 11/03/2020, 3:22 PM   Clinical Narrative:   Officers at bedside called correctional facility. I spoke w Jaw and Jasmine of the medical team there. Reported his DC meds. They will have them filled. Patient to be sent with paper scripts. Guards to transport. Facility requested no information to be faxed, but sent w officers.     Final next level of care: Corrections Facility Barriers to Discharge: No Barriers Identified   Patient Goals and CMS Choice        Discharge Placement                       Discharge Plan and Services                                     Social Determinants of Health (SDOH) Interventions     Readmission Risk Interventions No flowsheet data found.

## 2020-11-03 NOTE — Plan of Care (Signed)
?  Problem: Education: ?Goal: Knowledge of General Education information will improve ?Description: Including pain rating scale, medication(s)/side effects and non-pharmacologic comfort measures ?Outcome: Adequate for Discharge ?  ?Problem: Health Behavior/Discharge Planning: ?Goal: Ability to manage health-related needs will improve ?Outcome: Adequate for Discharge ?  ?Problem: Clinical Measurements: ?Goal: Ability to maintain clinical measurements within normal limits will improve ?Outcome: Adequate for Discharge ?Goal: Will remain free from infection ?Outcome: Adequate for Discharge ?Goal: Diagnostic test results will improve ?Outcome: Adequate for Discharge ?Goal: Respiratory complications will improve ?Outcome: Adequate for Discharge ?Goal: Cardiovascular complication will be avoided ?Outcome: Adequate for Discharge ?  ?Problem: Nutrition: ?Goal: Adequate nutrition will be maintained ?Outcome: Adequate for Discharge ?  ?Problem: Coping: ?Goal: Level of anxiety will decrease ?Outcome: Adequate for Discharge ?  ?Problem: Pain Managment: ?Goal: General experience of comfort will improve ?Outcome: Adequate for Discharge ?  ?Problem: Safety: ?Goal: Ability to remain free from injury will improve ?Outcome: Adequate for Discharge ?  ?Problem: Skin Integrity: ?Goal: Risk for impaired skin integrity will decrease ?Outcome: Adequate for Discharge ?  ?

## 2020-11-03 NOTE — Progress Notes (Signed)
Patient is now 48 hours seizure-free.  On exam today, he endorses symmetric sensation, moves all extremities well, extraocular movements intact, visual fields full.  No longer has the previously seen left-sided numbness with splitting of the midline to vibration.  At this point given that every time he goes back to jail, he immediately starts having seizures again, I have a high concern for nonorganic etiology.  Now with 48-hour seizure freedom, if he immediately begins having episodes again on return to jail, then psychogenic episodes needs to continue to be considered.  No further recommendations at this time, would continue Keppra 1 g twice daily which is what he was episode free on here.  He does have a reported history of epilepsy since childhood, with approximately one seizure per year at baseline.  Please call with any further questions or concerns.  Ritta Slot, MD [facility] [phone]  If 7pm- 7am, please page neurology on call as listed in AMION.

## 2020-11-04 NOTE — Procedures (Signed)
Patient Name:Earl [name] Epilepsy Attending:Esau Fridman [name] Referring Physician/Provider:DrMcNeil [name] Duration:12/11/20211433 to 12/11/20211604  Patient history:24 year old male with a history of seizures with the sudden abrupt increase in seizure frequency despite addition of Keppra.EEG to evaluate for seizure  Level of alertness:Awake  AEDs during EEG study:LEV  Technical aspects: This EEG study was done with scalp electrodes positioned according to the 10-20 International system of electrode placement. Electrical activity was acquired at a sampling rate of 500Hz  and reviewed with a high frequency filter of 70Hz  and a low frequency filter of 1Hz . EEG data were recorded continuously and digitally stored.   Description: The posterior dominant rhythm consists of 10 Hz activity of moderate voltage (25-35 uV) seen predominantly in posterior head regions, symmetric and reactive to eye opening and eye closing.  IMPRESSION: This study is within normal limits. No seizures or epileptiform discharges were seen throughout the recording.  Elektra Wartman 

## 2021-10-03 ENCOUNTER — Other Ambulatory Visit: Payer: Self-pay

## 2021-10-03 ENCOUNTER — Emergency Department (HOSPITAL_COMMUNITY)
Admission: EM | Admit: 2021-10-03 | Discharge: 2021-10-03 | Disposition: A | Discharge status: Home / Self Care | Payer: 59 | Attending: Emergency Medicine | Admitting: Emergency Medicine

## 2021-10-03 ENCOUNTER — Encounter (HOSPITAL_COMMUNITY): Payer: Self-pay | Admitting: Emergency Medicine

## 2021-10-03 DIAGNOSIS — Z23 Encounter for immunization: Secondary | ICD-10-CM | POA: Insufficient documentation

## 2021-10-03 DIAGNOSIS — Y92009 Unspecified place in unspecified non-institutional (private) residence as the place of occurrence of the external cause: Secondary | ICD-10-CM | POA: Insufficient documentation

## 2021-10-03 DIAGNOSIS — Z91010 Allergy to peanuts: Secondary | ICD-10-CM | POA: Diagnosis not present

## 2021-10-03 DIAGNOSIS — S0502XA Injury of conjunctiva and corneal abrasion without foreign body, left eye, initial encounter: Secondary | ICD-10-CM | POA: Insufficient documentation

## 2021-10-03 DIAGNOSIS — F17210 Nicotine dependence, cigarettes, uncomplicated: Secondary | ICD-10-CM | POA: Insufficient documentation

## 2021-10-03 DIAGNOSIS — X58XXXA Exposure to other specified factors, initial encounter: Secondary | ICD-10-CM | POA: Diagnosis not present

## 2021-10-03 MED ORDER — TETANUS-DIPHTH-ACELL PERTUSSIS 5-2.5-18.5 LF-MCG/0.5 IM SUSY
0.5000 mL | PREFILLED_SYRINGE | Freq: Once | INTRAMUSCULAR | Status: AC
  Administered 2021-10-03: 0.5 mL via INTRAMUSCULAR
  Filled 2021-10-03: qty 0.5

## 2021-10-03 MED ORDER — TETRACAINE HCL 0.5 % OP SOLN
2.0000 [drp] | Freq: Once | OPHTHALMIC | Status: AC
  Administered 2021-10-03: 2 [drp] via OPHTHALMIC
  Filled 2021-10-03: qty 4

## 2021-10-03 MED ORDER — ACETAMINOPHEN 325 MG PO TABS
650.0000 mg | ORAL_TABLET | Freq: Once | ORAL | Status: AC
  Administered 2021-10-03: 650 mg via ORAL
  Filled 2021-10-03: qty 2

## 2021-10-03 MED ORDER — FLUORESCEIN SODIUM 1 MG OP STRP
1.0000 | ORAL_STRIP | Freq: Once | OPHTHALMIC | Status: AC
  Administered 2021-10-03: 1 via OPHTHALMIC
  Filled 2021-10-03: qty 1

## 2021-10-03 MED ORDER — ERYTHROMYCIN 5 MG/GM OP OINT
TOPICAL_OINTMENT | Freq: Once | OPHTHALMIC | Status: AC
  Administered 2021-10-03: 1 via OPHTHALMIC
  Filled 2021-10-03: qty 3.5

## 2021-10-03 NOTE — ED Notes (Signed)
Left Eye 20/70, Right Eye 20/50

## 2021-10-03 NOTE — ED Triage Notes (Signed)
While cutting metal at home, a flick of metal hit the patient in the left eye. Flushing the eye did not help.    EMS vitals: 140/94 BP 85 HR 95% SPO2 on room air 18 RR
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
## 2021-10-03 NOTE — ED Notes (Addendum)
Pt refused vitals 

## 2021-10-03 NOTE — Discharge Instructions (Addendum)
Use erythromycin ointment every 6 hours for the next 7 days   Call the eye doctor tomorrow to schedule a follow up appointment   lease return to the emergency department for any new or worsening symptoms.

## 2021-10-15 ENCOUNTER — Emergency Department (HOSPITAL_COMMUNITY)
Admission: EM | Admit: 2021-10-15 | Discharge: 2021-10-15 | Disposition: A | Discharge status: Home / Self Care | Attending: Emergency Medicine | Admitting: Emergency Medicine

## 2021-10-15 ENCOUNTER — Other Ambulatory Visit: Payer: Self-pay

## 2021-10-15 ENCOUNTER — Encounter (HOSPITAL_COMMUNITY): Payer: Self-pay

## 2021-10-15 DIAGNOSIS — R110 Nausea: Secondary | ICD-10-CM | POA: Diagnosis not present

## 2021-10-15 DIAGNOSIS — G40909 Epilepsy, unspecified, not intractable, without status epilepticus: Secondary | ICD-10-CM | POA: Insufficient documentation

## 2021-10-15 DIAGNOSIS — R569 Unspecified convulsions: Secondary | ICD-10-CM | POA: Diagnosis present

## 2021-10-15 DIAGNOSIS — F1190 Opioid use, unspecified, uncomplicated: Secondary | ICD-10-CM | POA: Diagnosis not present

## 2021-10-15 DIAGNOSIS — Z91010 Allergy to peanuts: Secondary | ICD-10-CM | POA: Diagnosis not present

## 2021-10-15 DIAGNOSIS — F17210 Nicotine dependence, cigarettes, uncomplicated: Secondary | ICD-10-CM | POA: Insufficient documentation

## 2021-10-15 LAB — COMPREHENSIVE METABOLIC PANEL
ALT: 73 U/L — ABNORMAL HIGH (ref 0–44)
AST: 58 U/L — ABNORMAL HIGH (ref 15–41)
Albumin: 5 g/dL (ref 3.5–5.0)
Alkaline Phosphatase: 57 U/L (ref 38–126)
Anion gap: 13 (ref 5–15)
BUN: 13 mg/dL (ref 6–20)
CO2: 24 mmol/L (ref 22–32)
Calcium: 10.5 mg/dL — ABNORMAL HIGH (ref 8.9–10.3)
Chloride: 100 mmol/L (ref 98–111)
Creatinine, Ser: 1.15 mg/dL (ref 0.61–1.24)
GFR, Estimated: >60 mL/min (ref >60)
Glucose, Bld: 138 mg/dL — ABNORMAL HIGH (ref 70–99)
Potassium: 5.5 mmol/L — ABNORMAL HIGH (ref 3.5–5.1)
Sodium: 137 mmol/L (ref 135–145)
Total Bilirubin: 1.4 mg/dL — ABNORMAL HIGH (ref 0.3–1.2)
Total Protein: 9.1 g/dL — ABNORMAL HIGH (ref 6.5–8.1)

## 2021-10-15 LAB — CBC WITH DIFFERENTIAL/PLATELET
Abs Immature Granulocytes: 0.02 10*3/uL (ref 0.00–0.07)
Basophils Absolute: 0 10*3/uL (ref 0.0–0.1)
Basophils Relative: 0 %
Eosinophils Absolute: 0 10*3/uL (ref 0.0–0.5)
Eosinophils Relative: 0 %
HCT: 49.7 % (ref 39.0–52.0)
Hemoglobin: 17.4 g/dL — ABNORMAL HIGH (ref 13.0–17.0)
Immature Granulocytes: 0 %
Lymphocytes Relative: 30 %
Lymphs Abs: 1.9 10*3/uL (ref 0.7–4.0)
MCH: 31.1 pg (ref 26.0–34.0)
MCHC: 35 g/dL (ref 30.0–36.0)
MCV: 88.9 fL (ref 80.0–100.0)
Monocytes Absolute: 0.4 10*3/uL (ref 0.1–1.0)
Monocytes Relative: 7 %
Neutro Abs: 4 10*3/uL (ref 1.7–7.7)
Neutrophils Relative %: 63 %
Platelets: 270 10*3/uL (ref 150–400)
RBC: 5.59 MIL/uL (ref 4.22–5.81)
RDW: 12.1 % (ref 11.5–15.5)
WBC: 6.4 10*3/uL (ref 4.0–10.5)
nRBC: 0 % (ref 0.0–0.2)

## 2021-10-15 LAB — CBG MONITORING, ED: Glucose-Capillary: 132 mg/dL — ABNORMAL HIGH (ref 70–99)

## 2021-10-15 MED ORDER — SODIUM CHLORIDE 0.9 % IV BOLUS
1000.0000 mL | Freq: Once | INTRAVENOUS | Status: AC
  Administered 2021-10-15: 1000 mL via INTRAVENOUS

## 2021-10-15 MED ORDER — ONDANSETRON HCL 4 MG/2ML IJ SOLN
4.0000 mg | Freq: Once | INTRAMUSCULAR | Status: AC
  Administered 2021-10-15: 4 mg via INTRAVENOUS
  Filled 2021-10-15: qty 2

## 2021-10-15 MED ORDER — LORAZEPAM 2 MG/ML IJ SOLN
1.0000 mg | Freq: Once | INTRAMUSCULAR | Status: AC
  Administered 2021-10-15: 1 mg via INTRAVENOUS
  Filled 2021-10-15: qty 1

## 2021-10-15 MED ORDER — LEVETIRACETAM 500 MG PO TABS
1000.0000 mg | ORAL_TABLET | Freq: Once | ORAL | Status: AC
  Administered 2021-10-15: 1000 mg via ORAL
  Filled 2021-10-15: qty 2
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
## 2021-10-15 NOTE — Discharge Instructions (Addendum)
You came to the emergency department today to be evaluated for your seizure.  Your physical exam and lab work were reassuring.  You were given your home dose of Keppra medication.  Please continue take your Keppra medication 1000 mg twice daily.  Please follow-up with neurology in the outpatient setting.  Get help right away if: You have: A seizure that does not stop after 5 minutes. Several seizures in a row without a complete recovery between seizures. A seizure that makes it harder to breathe. A seizure that leaves you unable to speak or use a part of your body. You do not wake up right away after a seizure. You injure yourself during a seizure. You have confusion or pain right after a seizure.
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
## 2021-10-16 LAB — LEVETIRACETAM LEVEL: Levetiracetam Lvl: 2 ug/mL — ABNORMAL LOW (ref 10.0–40.0)

## 2022-08-07 IMAGING — CT CT CERVICAL SPINE W/O CM
3 of 4 series · 12 of 33 positions shown, 14 images · non-contrast
Comparison: 10/28/2020 CT head

CLINICAL DATA: Three witnessed seizures about 30 seconds each
occurring 5-7 minutes apart, was post ictal following seizures but
now alert and oriented, single episode urinary incontinence

EXAM:
CT HEAD WITHOUT CONTRAST
CT CERVICAL SPINE WITHOUT CONTRAST
TECHNIQUE: Multidetector CT imaging of the head and cervical spine was
performed following the standard protocol without intravenous
contrast. Multiplanar CT image reconstructions of the cervical spine
were also generated.

[Series 6: orthogonal bone · axial · 0.23mm/px · z∈[-302,-161]mm · 4 of 109 slices shown, 5 images]
[im 16/109  soft-tissue]
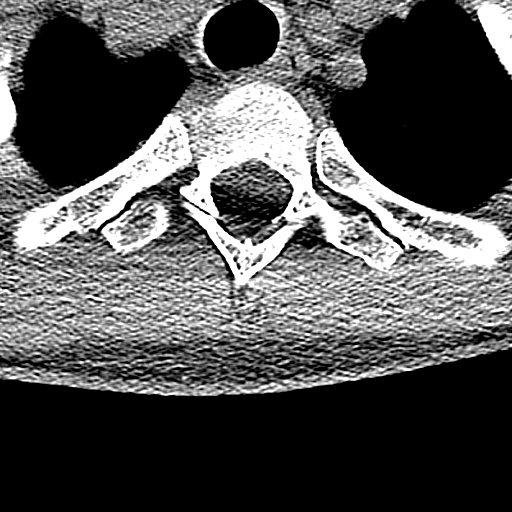
[im 16/109  bone]
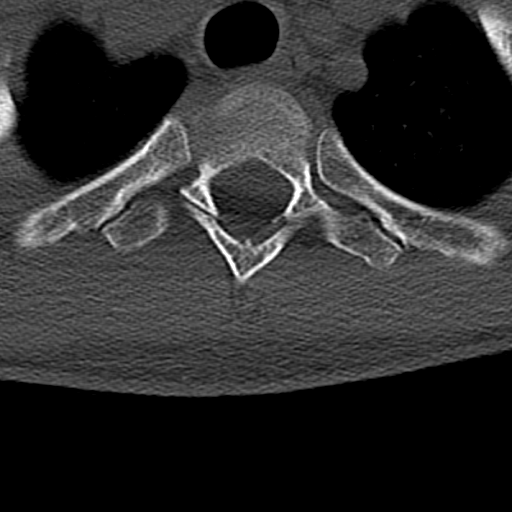
[im 47/109  bone]
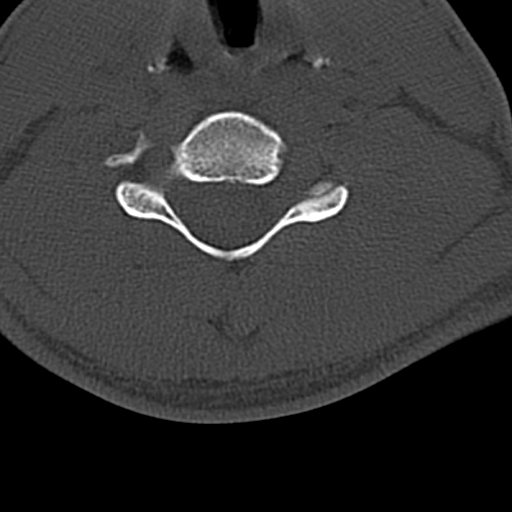
[im 62/109  bone]
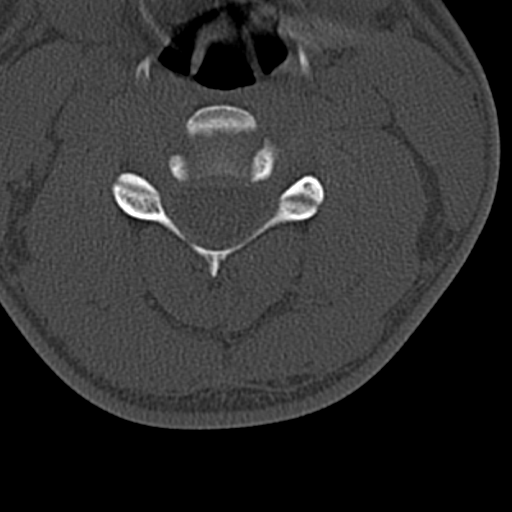
[im 93/109  bone]
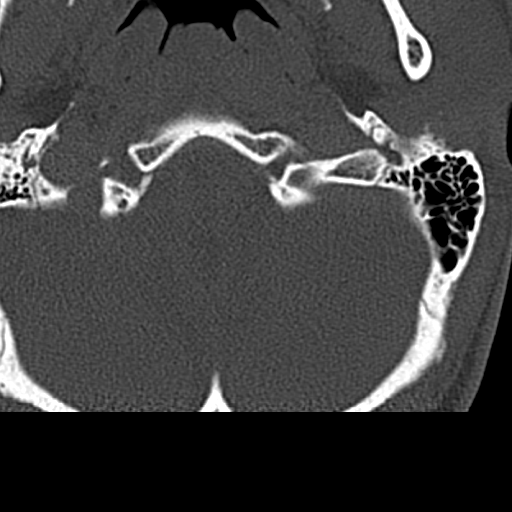

[Series 7: coronal bone · coronal · 0.27mm/px · 3 of 61 slices shown]
[im 13/61  bone]
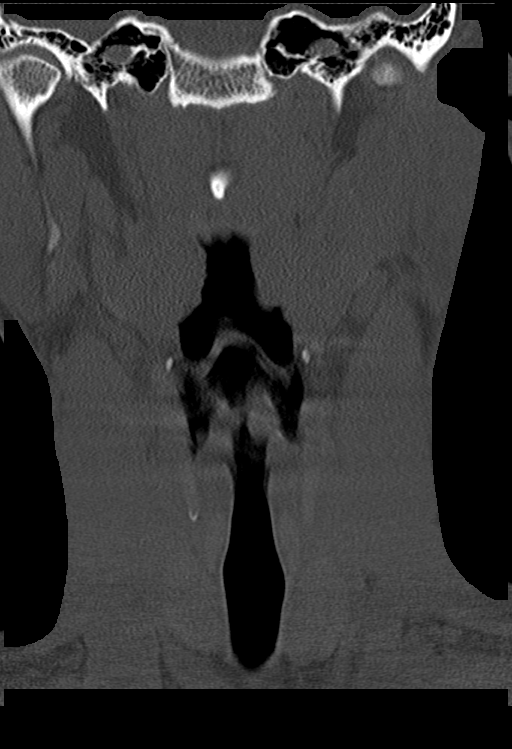
[im 25/61  bone]
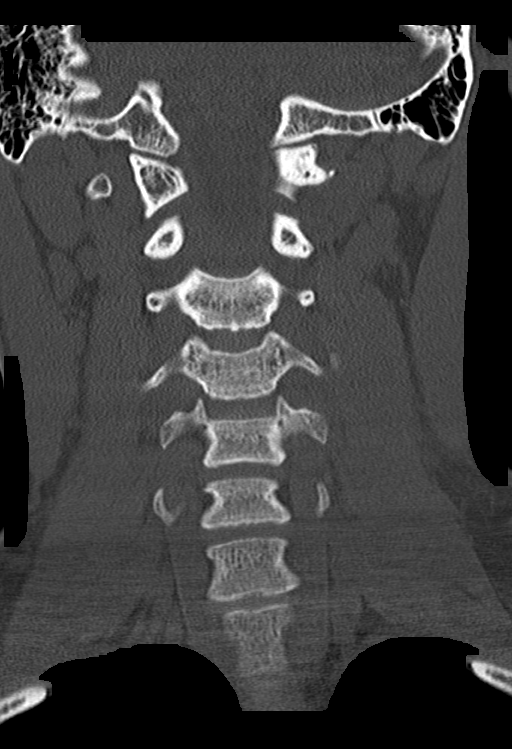
[im 37/61  bone]
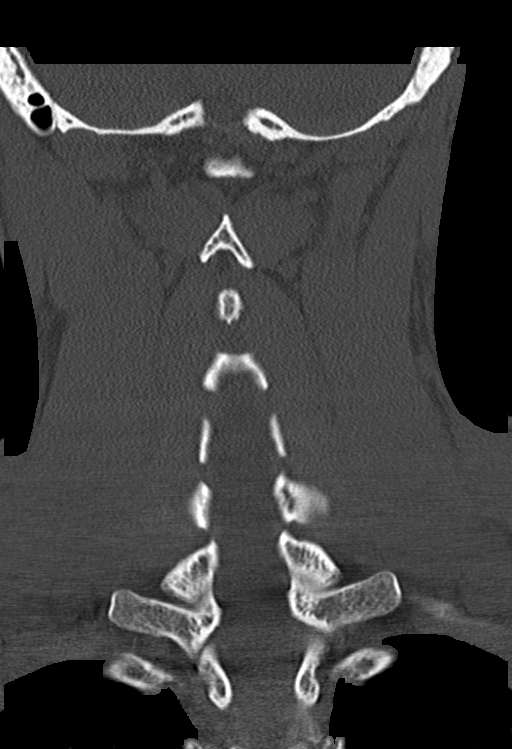

[Series 8: sagittal bone · sagittal · 0.28mm/px · 5 of 61 slices shown, 6 images]
[im 21/61  bone]
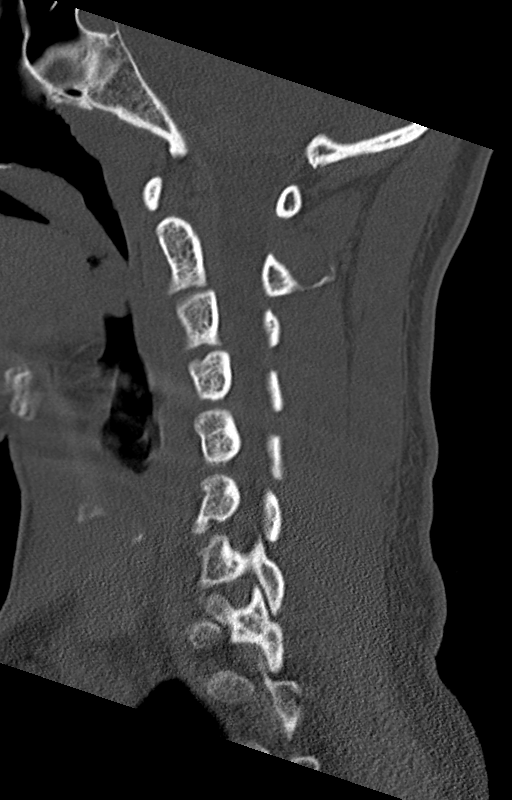
[im 26/61  bone]
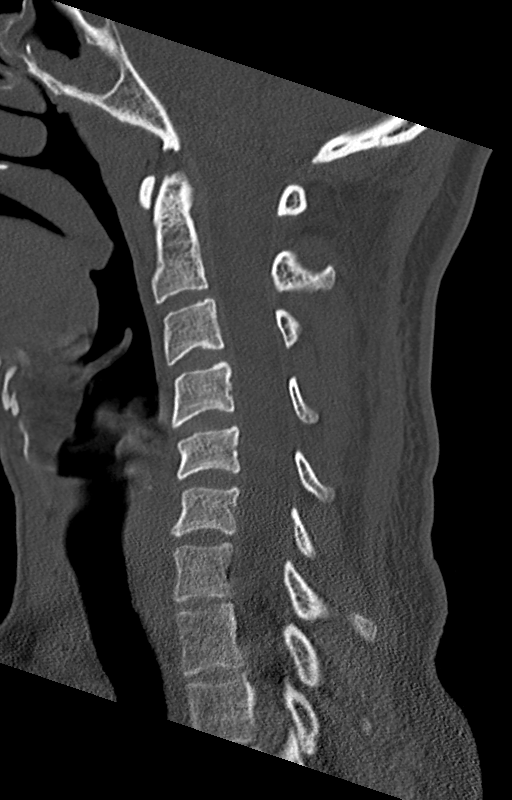
[im 31/61  soft-tissue]
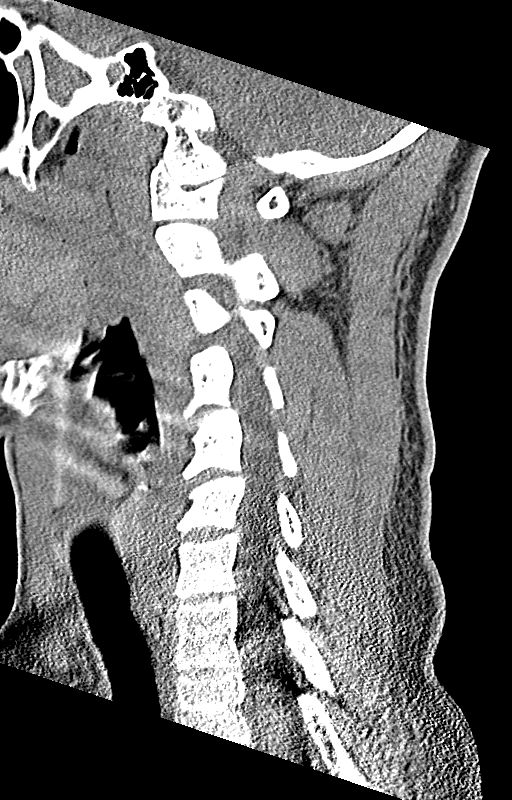
[im 31/61  bone]
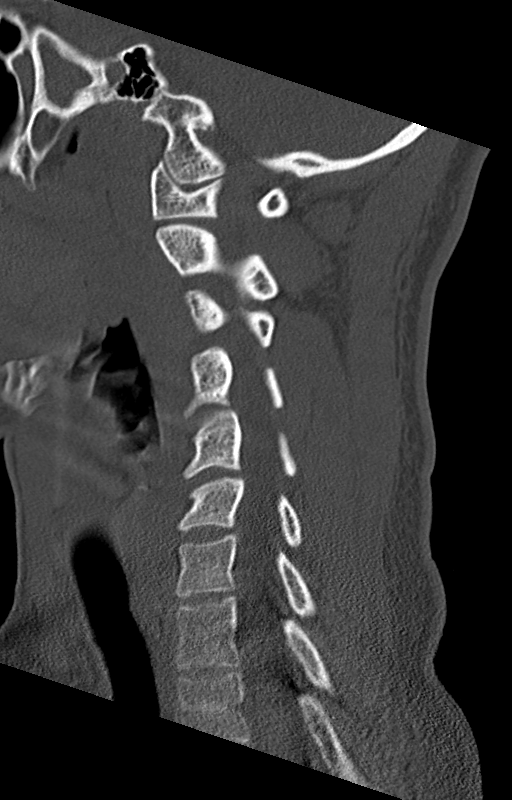
[im 36/61  bone]
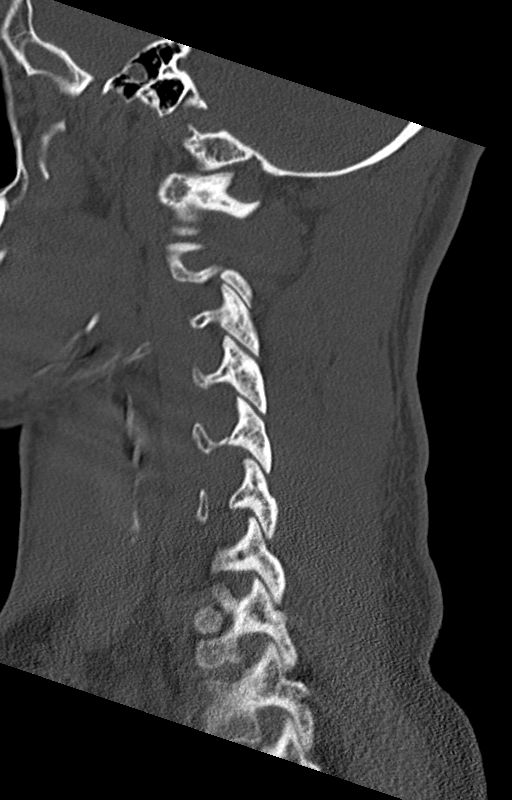
[im 41/61  bone]
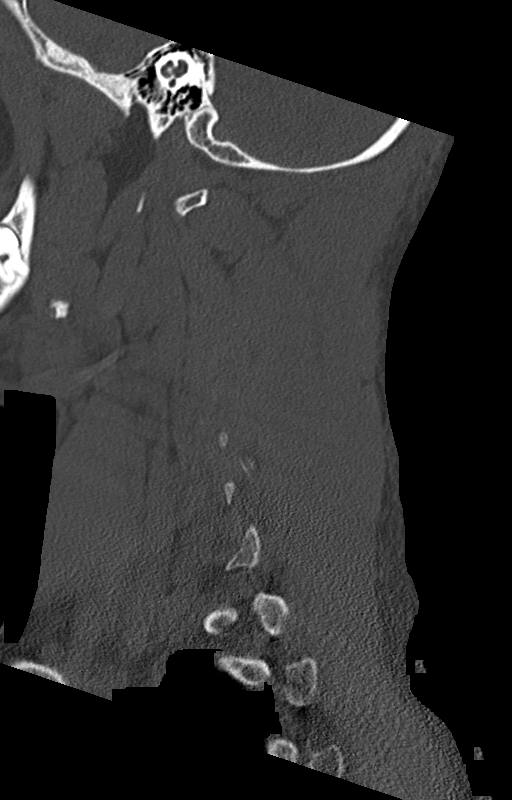

[12 of 33 positions shown; findings below may reference images not displayed]

FINDINGS: CT HEAD FINDINGS

Brain: Normal ventricular morphology. No midline shift or mass
effect. Normal appearance of brain parenchyma. No intracranial
hemorrhage, mass lesion, evidence of acute infarction, or
extra-axial fluid collection.

Vascular: No hyperdense vessels

Skull: Intact

Sinuses/Orbits: Opacified RIGHT frontal sinus and LEFT sphenoid
sinus unchanged.

Other: N/A

CT CERVICAL SPINE FINDINGS

Alignment: Normal

Skull base and vertebrae: Osseous mineralization normal. Skull base
intact. Vertebral body and disc space heights maintained. Facet
alignments normal. No fracture, subluxation, or bone destruction.

Soft tissues and spinal canal: Prevertebral soft tissues normal
thickness. Large calcification LEFT submandibular gland question
sialolith.

Disc levels:  No specific abnormalities

Upper chest: Lung apices clear

Other: N/A
IMPRESSION: No acute intracranial abnormalities.

Chronic RIGHT frontal and LEFT sphenoid sinus disease.

No acute cervical spine abnormalities.

Large calcification in LEFT submandibular gland question sialolith.

## 2023-09-25 ENCOUNTER — Emergency Department (HOSPITAL_COMMUNITY): Payer: No Typology Code available for payment source

## 2023-09-25 ENCOUNTER — Other Ambulatory Visit: Payer: Self-pay

## 2023-09-25 ENCOUNTER — Inpatient Hospital Stay (HOSPITAL_COMMUNITY)
Admission: EM | Admit: 2023-09-25 | Discharge: 2023-10-02 | DRG: 493 | Disposition: A | Discharge status: Home / Self Care | Payer: No Typology Code available for payment source | Attending: Surgery | Admitting: Surgery

## 2023-09-25 DIAGNOSIS — S82202A Unspecified fracture of shaft of left tibia, initial encounter for closed fracture: Secondary | ICD-10-CM | POA: Diagnosis present

## 2023-09-25 DIAGNOSIS — S82145A Nondisplaced bicondylar fracture of left tibia, initial encounter for closed fracture: Principal | ICD-10-CM | POA: Diagnosis present

## 2023-09-25 DIAGNOSIS — S82831A Other fracture of upper and lower end of right fibula, initial encounter for closed fracture: Secondary | ICD-10-CM | POA: Diagnosis present

## 2023-09-25 DIAGNOSIS — Z5900 Homelessness unspecified: Secondary | ICD-10-CM

## 2023-09-25 DIAGNOSIS — Y92488 Other paved roadways as the place of occurrence of the external cause: Secondary | ICD-10-CM

## 2023-09-25 DIAGNOSIS — F430 Acute stress reaction: Secondary | ICD-10-CM | POA: Diagnosis not present

## 2023-09-25 DIAGNOSIS — F32A Depression, unspecified: Secondary | ICD-10-CM | POA: Diagnosis present

## 2023-09-25 DIAGNOSIS — F1110 Opioid abuse, uncomplicated: Secondary | ICD-10-CM | POA: Diagnosis present

## 2023-09-25 DIAGNOSIS — S82192A Other fracture of upper end of left tibia, initial encounter for closed fracture: Secondary | ICD-10-CM

## 2023-09-25 DIAGNOSIS — F17210 Nicotine dependence, cigarettes, uncomplicated: Secondary | ICD-10-CM | POA: Diagnosis present

## 2023-09-25 DIAGNOSIS — G4700 Insomnia, unspecified: Secondary | ICD-10-CM | POA: Diagnosis present

## 2023-09-25 DIAGNOSIS — S2249XA Multiple fractures of ribs, unspecified side, initial encounter for closed fracture: Secondary | ICD-10-CM | POA: Diagnosis present

## 2023-09-25 DIAGNOSIS — S2242XA Multiple fractures of ribs, left side, initial encounter for closed fracture: Secondary | ICD-10-CM | POA: Diagnosis present

## 2023-09-25 DIAGNOSIS — S82402A Unspecified fracture of shaft of left fibula, initial encounter for closed fracture: Secondary | ICD-10-CM | POA: Diagnosis present

## 2023-09-25 DIAGNOSIS — J939 Pneumothorax, unspecified: Secondary | ICD-10-CM | POA: Diagnosis present

## 2023-09-25 DIAGNOSIS — Z79899 Other long term (current) drug therapy: Secondary | ICD-10-CM

## 2023-09-25 DIAGNOSIS — Z91010 Allergy to peanuts: Secondary | ICD-10-CM

## 2023-09-25 DIAGNOSIS — Z560 Unemployment, unspecified: Secondary | ICD-10-CM

## 2023-09-25 DIAGNOSIS — G40909 Epilepsy, unspecified, not intractable, without status epilepticus: Secondary | ICD-10-CM | POA: Diagnosis present

## 2023-09-25 DIAGNOSIS — S270XXA Traumatic pneumothorax, initial encounter: Principal | ICD-10-CM | POA: Diagnosis present

## 2023-09-25 LAB — CBC
HCT: 42.1 % (ref 39.0–52.0)
Hemoglobin: 14.3 g/dL (ref 13.0–17.0)
MCH: 30.3 pg (ref 26.0–34.0)
MCHC: 34 g/dL (ref 30.0–36.0)
MCV: 89.2 fL (ref 80.0–100.0)
Platelets: 327 10*3/uL (ref 150–400)
RBC: 4.72 MIL/uL (ref 4.22–5.81)
RDW: 12.4 % (ref 11.5–15.5)
WBC: 9.3 10*3/uL (ref 4.0–10.5)
nRBC: 0 % (ref 0.0–0.2)

## 2023-09-25 LAB — I-STAT CHEM 8, ED
BUN: 10 mg/dL (ref 6–20)
Calcium, Ion: 1.09 mmol/L — ABNORMAL LOW (ref 1.15–1.40)
Chloride: 102 mmol/L (ref 98–111)
Creatinine, Ser: 1.1 mg/dL (ref 0.61–1.24)
Glucose, Bld: 118 mg/dL — ABNORMAL HIGH (ref 70–99)
HCT: 43 % (ref 39.0–52.0)
Hemoglobin: 14.6 g/dL (ref 13.0–17.0)
Potassium: 3.6 mmol/L (ref 3.5–5.1)
Sodium: 139 mmol/L (ref 135–145)
TCO2: 24 mmol/L (ref 22–32)

## 2023-09-25 LAB — COMPREHENSIVE METABOLIC PANEL
ALT: 54 U/L — ABNORMAL HIGH (ref 0–44)
AST: 46 U/L — ABNORMAL HIGH (ref 15–41)
Albumin: 3.8 g/dL (ref 3.5–5.0)
Alkaline Phosphatase: 53 U/L (ref 38–126)
Anion gap: 14 (ref 5–15)
BUN: 9 mg/dL (ref 6–20)
CO2: 22 mmol/L (ref 22–32)
Calcium: 9.1 mg/dL (ref 8.9–10.3)
Chloride: 103 mmol/L (ref 98–111)
Creatinine, Ser: 1.16 mg/dL (ref 0.61–1.24)
GFR, Estimated: >60 mL/min (ref >60)
Glucose, Bld: 121 mg/dL — ABNORMAL HIGH (ref 70–99)
Potassium: 3.6 mmol/L (ref 3.5–5.1)
Sodium: 139 mmol/L (ref 135–145)
Total Bilirubin: 0.8 mg/dL (ref 0.3–1.2)
Total Protein: 6.6 g/dL (ref 6.5–8.1)

## 2023-09-25 LAB — ETHANOL: Alcohol, Ethyl (B): <10 mg/dL (ref <10)

## 2023-09-25 LAB — SAMPLE TO BLOOD BANK

## 2023-09-25 LAB — I-STAT CG4 LACTIC ACID, ED: Lactic Acid, Venous: 2.1 mmol/L (ref 0.5–1.9)

## 2023-09-25 LAB — PROTIME-INR
INR: 1 (ref 0.8–1.2)
Prothrombin Time: 13 s (ref 11.4–15.2)

## 2023-09-25 MED ORDER — HYDROMORPHONE HCL 1 MG/ML IJ SOLN
1.0000 mg | INTRAMUSCULAR | Status: DC | PRN
  Administered 2023-09-26 – 2023-10-01 (×11): 1 mg via INTRAVENOUS
  Filled 2023-09-25 (×12): qty 1

## 2023-09-25 MED ORDER — FENTANYL CITRATE PF 50 MCG/ML IJ SOSY
50.0000 ug | PREFILLED_SYRINGE | Freq: Once | INTRAMUSCULAR | Status: AC
  Administered 2023-09-25: 50 ug via INTRAVENOUS
  Filled 2023-09-25: qty 1
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
## 2023-09-25 NOTE — ED Notes (Signed)
Report received from [employer]
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
## 2023-09-26 ENCOUNTER — Encounter (HOSPITAL_COMMUNITY): Payer: Self-pay | Admitting: Surgery

## 2023-09-26 ENCOUNTER — Inpatient Hospital Stay (HOSPITAL_COMMUNITY): Payer: No Typology Code available for payment source

## 2023-09-26 DIAGNOSIS — S2242XA Multiple fractures of ribs, left side, initial encounter for closed fracture: Secondary | ICD-10-CM | POA: Diagnosis present

## 2023-09-26 DIAGNOSIS — S82831A Other fracture of upper and lower end of right fibula, initial encounter for closed fracture: Secondary | ICD-10-CM | POA: Diagnosis present

## 2023-09-26 DIAGNOSIS — Z5900 Homelessness unspecified: Secondary | ICD-10-CM | POA: Diagnosis not present

## 2023-09-26 DIAGNOSIS — Y92488 Other paved roadways as the place of occurrence of the external cause: Secondary | ICD-10-CM | POA: Diagnosis not present

## 2023-09-26 DIAGNOSIS — S270XXA Traumatic pneumothorax, initial encounter: Secondary | ICD-10-CM | POA: Diagnosis present

## 2023-09-26 DIAGNOSIS — G40909 Epilepsy, unspecified, not intractable, without status epilepticus: Secondary | ICD-10-CM | POA: Diagnosis present

## 2023-09-26 DIAGNOSIS — J939 Pneumothorax, unspecified: Secondary | ICD-10-CM | POA: Diagnosis not present

## 2023-09-26 DIAGNOSIS — F17210 Nicotine dependence, cigarettes, uncomplicated: Secondary | ICD-10-CM | POA: Diagnosis present

## 2023-09-26 DIAGNOSIS — S82102A Unspecified fracture of upper end of left tibia, initial encounter for closed fracture: Secondary | ICD-10-CM | POA: Diagnosis not present

## 2023-09-26 DIAGNOSIS — F430 Acute stress reaction: Secondary | ICD-10-CM | POA: Diagnosis not present

## 2023-09-26 DIAGNOSIS — F32A Depression, unspecified: Secondary | ICD-10-CM | POA: Diagnosis present

## 2023-09-26 DIAGNOSIS — G4700 Insomnia, unspecified: Secondary | ICD-10-CM | POA: Diagnosis present

## 2023-09-26 DIAGNOSIS — R569 Unspecified convulsions: Secondary | ICD-10-CM | POA: Diagnosis not present

## 2023-09-26 DIAGNOSIS — S82402A Unspecified fracture of shaft of left fibula, initial encounter for closed fracture: Secondary | ICD-10-CM | POA: Diagnosis present

## 2023-09-26 DIAGNOSIS — Z560 Unemployment, unspecified: Secondary | ICD-10-CM | POA: Diagnosis not present

## 2023-09-26 DIAGNOSIS — S2249XA Multiple fractures of ribs, unspecified side, initial encounter for closed fracture: Secondary | ICD-10-CM | POA: Diagnosis present

## 2023-09-26 DIAGNOSIS — S82145A Nondisplaced bicondylar fracture of left tibia, initial encounter for closed fracture: Secondary | ICD-10-CM | POA: Diagnosis present

## 2023-09-26 DIAGNOSIS — F1110 Opioid abuse, uncomplicated: Secondary | ICD-10-CM | POA: Diagnosis present

## 2023-09-26 DIAGNOSIS — Z79899 Other long term (current) drug therapy: Secondary | ICD-10-CM | POA: Diagnosis not present

## 2023-09-26 DIAGNOSIS — Z91010 Allergy to peanuts: Secondary | ICD-10-CM | POA: Diagnosis not present

## 2023-09-26 LAB — URINALYSIS, ROUTINE W REFLEX MICROSCOPIC
Bacteria, UA: NONE SEEN
Bilirubin Urine: NEGATIVE
Glucose, UA: NEGATIVE mg/dL
Ketones, ur: NEGATIVE mg/dL
Leukocytes,Ua: NEGATIVE
Nitrite: NEGATIVE
Protein, ur: NEGATIVE mg/dL
Specific Gravity, Urine: 1.036 — ABNORMAL HIGH (ref 1.005–1.030)
pH: 6 (ref 5.0–8.0)

## 2023-09-26 LAB — HIV ANTIBODY (ROUTINE TESTING W REFLEX): HIV Screen 4th Generation wRfx: NONREACTIVE

## 2023-09-26 MED ORDER — HYDRALAZINE HCL 20 MG/ML IJ SOLN
10.0000 mg | INTRAMUSCULAR | Status: DC | PRN
Start: 2023-09-26 — End: 2023-10-02

## 2023-09-26 MED ORDER — ACETAMINOPHEN 500 MG PO TABS
1000.0000 mg | ORAL_TABLET | Freq: Four times a day (QID) | ORAL | Status: DC
  Administered 2023-09-26 – 2023-10-02 (×24): 1000 mg via ORAL
  Filled 2023-09-26 (×26): qty 2

## 2023-09-26 MED ORDER — METHOCARBAMOL 500 MG PO TABS
500.0000 mg | ORAL_TABLET | Freq: Three times a day (TID) | ORAL | Status: DC
  Administered 2023-09-26 – 2023-09-28 (×7): 500 mg via ORAL
  Filled 2023-09-26 (×7): qty 1

## 2023-09-26 MED ORDER — POLYETHYLENE GLYCOL 3350 17 G PO PACK
17.0000 g | PACK | Freq: Every day | ORAL | Status: DC | PRN
Start: 2023-09-26 — End: 2023-10-02

## 2023-09-26 MED ORDER — DOCUSATE SODIUM 100 MG PO CAPS
100.0000 mg | ORAL_CAPSULE | Freq: Two times a day (BID) | ORAL | Status: DC
  Administered 2023-09-27: 100 mg via ORAL
  Filled 2023-09-26 (×8): qty 1

## 2023-09-26 MED ORDER — MELATONIN 3 MG PO TABS
3.0000 mg | ORAL_TABLET | Freq: Every evening | ORAL | Status: DC | PRN
  Administered 2023-09-30 – 2023-10-02 (×2): 3 mg via ORAL
  Filled 2023-09-26 (×2): qty 1

## 2023-09-26 MED ORDER — OXYCODONE HCL 5 MG PO TABS
5.0000 mg | ORAL_TABLET | ORAL | Status: DC | PRN
  Administered 2023-09-26 – 2023-09-30 (×11): 10 mg via ORAL
  Administered 2023-10-01: 5 mg via ORAL
  Administered 2023-10-01: 10 mg via ORAL
  Filled 2023-09-26 (×8): qty 2
  Filled 2023-09-26: qty 1
  Filled 2023-09-26 (×7): qty 2

## 2023-09-26 MED ORDER — IOHEXOL 350 MG/ML SOLN
100.0000 mL | Freq: Once | INTRAVENOUS | Status: AC | PRN
  Administered 2023-09-26: 100 mL via INTRAVENOUS

## 2023-09-26 MED ORDER — ONDANSETRON HCL 4 MG/2ML IJ SOLN
4.0000 mg | Freq: Four times a day (QID) | INTRAMUSCULAR | Status: DC | PRN
  Administered 2023-09-30: 4 mg via INTRAVENOUS

## 2023-09-26 MED ORDER — METOPROLOL TARTRATE 5 MG/5ML IV SOLN
5.0000 mg | Freq: Four times a day (QID) | INTRAVENOUS | Status: DC | PRN
Start: 2023-09-26 — End: 2023-10-02

## 2023-09-26 MED ORDER — HYDROMORPHONE HCL 1 MG/ML IJ SOLN
0.5000 mg | INTRAMUSCULAR | Status: DC | PRN
  Administered 2023-09-30: .5 mg via INTRAVENOUS
  Filled 2023-09-26 (×2): qty 0.5

## 2023-09-26 MED ORDER — ONDANSETRON 4 MG PO TBDP: 4.0000 mg | ORAL_TABLET | Freq: Four times a day (QID) | ORAL | Status: DC | PRN

## 2023-09-26 MED ORDER — IBUPROFEN 600 MG PO TABS
600.0000 mg | ORAL_TABLET | Freq: Four times a day (QID) | ORAL | Status: AC
  Administered 2023-09-26 – 2023-09-30 (×19): 600 mg via ORAL
  Filled 2023-09-26 (×19): qty 1

## 2023-09-26 MED ORDER — METHOCARBAMOL 1000 MG/10ML IJ SOLN: 500.0000 mg | Freq: Three times a day (TID) | INTRAMUSCULAR | Status: DC

## 2023-09-26 MED ORDER — ENOXAPARIN SODIUM 30 MG/0.3ML IJ SOSY
30.0000 mg | PREFILLED_SYRINGE | Freq: Two times a day (BID) | INTRAMUSCULAR | Status: DC
  Administered 2023-09-27 – 2023-10-02 (×8): 30 mg via SUBCUTANEOUS
  Filled 2023-09-26 (×11): qty 0.3

## 2023-09-26 MED ORDER — LEVETIRACETAM 500 MG PO TABS
1000.0000 mg | ORAL_TABLET | Freq: Two times a day (BID) | ORAL | Status: DC
  Administered 2023-09-26 – 2023-10-02 (×13): 1000 mg via ORAL
  Filled 2023-09-26 (×13): qty 2

## 2023-09-26 NOTE — ED Notes (Signed)
Ortho tech at bedside. Knee Immobilizer applied to LLE

## 2023-09-26 NOTE — Plan of Care (Signed)
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
## 2023-09-26 NOTE — Plan of Care (Signed)

## 2023-09-27 ENCOUNTER — Inpatient Hospital Stay (HOSPITAL_COMMUNITY): Payer: No Typology Code available for payment source

## 2023-09-27 NOTE — Plan of Care (Signed)
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
## 2023-09-28 ENCOUNTER — Inpatient Hospital Stay (HOSPITAL_COMMUNITY): Payer: No Typology Code available for payment source

## 2023-09-28 LAB — CBC
HCT: 38.3 % — ABNORMAL LOW (ref 39.0–52.0)
Hemoglobin: 12.9 g/dL — ABNORMAL LOW (ref 13.0–17.0)
MCH: 30.4 pg (ref 26.0–34.0)
MCHC: 33.7 g/dL (ref 30.0–36.0)
MCV: 90.3 fL (ref 80.0–100.0)
Platelets: 228 10*3/uL (ref 150–400)
RBC: 4.24 MIL/uL (ref 4.22–5.81)
RDW: 12.7 % (ref 11.5–15.5)
WBC: 7 10*3/uL (ref 4.0–10.5)
nRBC: 0 % (ref 0.0–0.2)

## 2023-09-28 MED ORDER — HYDROMORPHONE HCL 1 MG/ML IJ SOLN
1.0000 mg | Freq: Once | INTRAMUSCULAR | Status: AC
  Administered 2023-09-28: 1 mg via INTRAVENOUS

## 2023-09-28 MED ORDER — METHOCARBAMOL 500 MG PO TABS
1000.0000 mg | ORAL_TABLET | Freq: Four times a day (QID) | ORAL | Status: DC
Start: 2023-09-28 — End: 2023-10-02
  Administered 2023-09-28 – 2023-10-02 (×15): 1000 mg via ORAL
  Filled 2023-09-28 (×15): qty 2

## 2023-09-28 MED ORDER — METHOCARBAMOL 1000 MG/10ML IJ SOLN
500.0000 mg | Freq: Four times a day (QID) | INTRAMUSCULAR | Status: DC
Start: 2023-09-28 — End: 2023-09-30

## 2023-09-28 NOTE — Progress Notes (Signed)
Assisted with CT placement with Dr Bedelia Person. Pt tolerated procedure well. VSS, CXR ordered.

## 2023-09-28 NOTE — Procedures (Signed)
   Procedure Note  Date: 09/28/2023  Procedure: tube thoracostomy--left    Pre-op diagnosis: left pneumothorax  Post-op diagnosis: same  Surgeon: Diamantina Monks, MD  Anesthesia: local   EBL: <5cc procedural; 0cc     evacuated Drains/Implants: 25F chest tube Specimen: none  Description of procedure: Time-out was performed verifying correct patient, procedure, site, laterality, and signature of informed consent. Five cc's of local anesthetic was infiltrated into the tissues just over the fourth intercostal space.  A small skin nick was made at the fourth intercostal space. An introducer needle was inserted and a guidewire inserted through the needle. The needle was removed and the tract dilated. The chest tube was inserted over the guidewire and the guidewire removed.   The tube was secured at the skin with suture and connected to an atrium at -20cm water wall suction. Immediate output from the chest tube was 0cc. The site was dressed with xeroform, gauze, and tape. The patient tolerated the procedure well. There were no complications. Follow up chest x-ray was ordered to confirm tube positioning, complete evacuation, and complete lung re-expansion.    Diamantina Monks, MD General and Trauma Surgery [facility] Surgery
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
## 2023-09-28 NOTE — TOC CAGE-AID Note (Signed)
Transition of Care [facility]) - CAGE-AID Screening   Patient Details  Name: Earl [name] MRN: [id number] Date of Birth: [birth year]  Hewitt Shorts, RN Trauma Response Nurse Phone Number: [phone] 09/28/2023, 1:23 PM      CAGE-AID Screening:    Have You Ever Felt You Ought to Cut Down on Your Drinking or Drug Use?: No Have People Annoyed You By Critizing Your Drinking Or Drug Use?: No Have You Felt Bad Or Guilty About Your Drinking Or Drug Use?: No Have You Ever Had a Drink or Used Drugs First Thing In The Morning to Steady Your Nerves or to Get Rid of a Hangover?: No CAGE-AID Score: 0  Substance Abuse Education Offered: No

## 2023-09-29 ENCOUNTER — Inpatient Hospital Stay (HOSPITAL_COMMUNITY): Payer: No Typology Code available for payment source
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
## 2023-09-30 ENCOUNTER — Inpatient Hospital Stay (HOSPITAL_COMMUNITY): Payer: No Typology Code available for payment source | Admitting: Anesthesiology

## 2023-09-30 ENCOUNTER — Inpatient Hospital Stay (HOSPITAL_COMMUNITY): Payer: No Typology Code available for payment source

## 2023-09-30 ENCOUNTER — Other Ambulatory Visit: Payer: Self-pay

## 2023-09-30 ENCOUNTER — Encounter (HOSPITAL_COMMUNITY): Payer: Self-pay | Admitting: Surgery

## 2023-09-30 ENCOUNTER — Encounter (HOSPITAL_COMMUNITY): Admission: EM | Disposition: A | Discharge status: Home / Self Care | Payer: Self-pay | Source: Home / Self Care

## 2023-09-30 DIAGNOSIS — R569 Unspecified convulsions: Secondary | ICD-10-CM

## 2023-09-30 DIAGNOSIS — S82102A Unspecified fracture of upper end of left tibia, initial encounter for closed fracture: Secondary | ICD-10-CM

## 2023-09-30 DIAGNOSIS — J939 Pneumothorax, unspecified: Secondary | ICD-10-CM

## 2023-09-30 HISTORY — PX: ORIF TIBIA PLATEAU: SHX2132

## 2023-09-30 LAB — CBC
HCT: 37.8 % — ABNORMAL LOW (ref 39.0–52.0)
Hemoglobin: 12.7 g/dL — ABNORMAL LOW (ref 13.0–17.0)
MCH: 30.3 pg (ref 26.0–34.0)
MCHC: 33.6 g/dL (ref 30.0–36.0)
MCV: 90.2 fL (ref 80.0–100.0)
Platelets: 226 10*3/uL (ref 150–400)
RBC: 4.19 MIL/uL — ABNORMAL LOW (ref 4.22–5.81)
RDW: 12.4 % (ref 11.5–15.5)
WBC: 4.7 10*3/uL (ref 4.0–10.5)
nRBC: 0 % (ref 0.0–0.2)

## 2023-09-30 LAB — BASIC METABOLIC PANEL
Anion gap: 7 (ref 5–15)
BUN: 14 mg/dL (ref 6–20)
CO2: 25 mmol/L (ref 22–32)
Calcium: 8.6 mg/dL — ABNORMAL LOW (ref 8.9–10.3)
Chloride: 105 mmol/L (ref 98–111)
Creatinine, Ser: 0.9 mg/dL (ref 0.61–1.24)
GFR, Estimated: >60 mL/min (ref >60)
Glucose, Bld: 92 mg/dL (ref 70–99)
Potassium: 4.3 mmol/L (ref 3.5–5.1)
Sodium: 137 mmol/L (ref 135–145)

## 2023-09-30 LAB — SURGICAL PCR SCREEN
MRSA, PCR: NEGATIVE
Staphylococcus aureus: NEGATIVE

## 2023-09-30 SURGERY — OPEN REDUCTION INTERNAL FIXATION (ORIF) TIBIAL PLATEAU
Anesthesia: General | Site: Leg Lower | Laterality: Left

## 2023-09-30 MED ORDER — ARTIFICIAL TEARS OPHTHALMIC OINT
TOPICAL_OINTMENT | OPHTHALMIC | Status: AC
Start: 2023-09-30 — End: ?
  Filled 2023-09-30: qty 3.5

## 2023-09-30 MED ORDER — LACTATED RINGERS IV SOLN: INTRAVENOUS | Status: DC | PRN

## 2023-09-30 MED ORDER — METOCLOPRAMIDE HCL 5 MG/ML IJ SOLN: 5.0000 mg | Freq: Three times a day (TID) | INTRAMUSCULAR | Status: DC | PRN

## 2023-09-30 MED ORDER — HYDROMORPHONE HCL 1 MG/ML IJ SOLN
INTRAMUSCULAR | Status: AC
  Filled 2023-09-30: qty 0.5

## 2023-09-30 MED ORDER — METHOCARBAMOL 500 MG PO TABS: 500.0000 mg | ORAL_TABLET | Freq: Four times a day (QID) | ORAL | Status: DC | PRN

## 2023-09-30 MED ORDER — METHOCARBAMOL 1000 MG/10ML IJ SOLN
500.0000 mg | Freq: Four times a day (QID) | INTRAMUSCULAR | Status: DC | PRN
Start: 2023-09-30 — End: 2023-09-30

## 2023-09-30 MED ORDER — FENTANYL CITRATE (PF) 250 MCG/5ML IJ SOLN
INTRAMUSCULAR | Status: AC
  Filled 2023-09-30: qty 5

## 2023-09-30 MED ORDER — ONDANSETRON HCL 4 MG/2ML IJ SOLN
INTRAMUSCULAR | Status: AC
  Filled 2023-09-30: qty 2

## 2023-09-30 MED ORDER — SUGAMMADEX SODIUM 200 MG/2ML IV SOLN
INTRAVENOUS | Status: DC | PRN
  Administered 2023-09-30: 100 mg via INTRAVENOUS

## 2023-09-30 MED ORDER — OXYCODONE HCL 5 MG PO TABS: 5.0000 mg | ORAL_TABLET | Freq: Once | ORAL | Status: DC | PRN

## 2023-09-30 MED ORDER — TRANEXAMIC ACID-NACL 1000-0.7 MG/100ML-% IV SOLN
1000.0000 mg | INTRAVENOUS | Status: AC
  Administered 2023-09-30: 1000 mg via INTRAVENOUS
  Filled 2023-09-30: qty 100

## 2023-09-30 MED ORDER — OXYCODONE HCL 5 MG/5ML PO SOLN: 5.0000 mg | Freq: Once | ORAL | Status: DC | PRN

## 2023-09-30 MED ORDER — LORAZEPAM 2 MG/ML IJ SOLN: 1.0000 mg | INTRAMUSCULAR | Status: DC | PRN

## 2023-09-30 MED ORDER — METOCLOPRAMIDE HCL 5 MG PO TABS: 5.0000 mg | ORAL_TABLET | Freq: Three times a day (TID) | ORAL | Status: DC | PRN

## 2023-09-30 MED ORDER — MIDAZOLAM HCL 2 MG/2ML IJ SOLN
INTRAMUSCULAR | Status: DC | PRN
  Administered 2023-09-30: 2 mg via INTRAVENOUS

## 2023-09-30 MED ORDER — PROPOFOL 10 MG/ML IV BOLUS
INTRAVENOUS | Status: AC
Start: 2023-09-30 — End: ?
  Filled 2023-09-30: qty 20

## 2023-09-30 MED ORDER — CHLORHEXIDINE GLUCONATE 0.12 % MT SOLN
OROMUCOSAL | Status: AC
  Administered 2023-09-30: 15 mL
  Filled 2023-09-30: qty 15

## 2023-09-30 MED ORDER — LORAZEPAM 1 MG PO TABS
1.0000 mg | ORAL_TABLET | ORAL | Status: DC | PRN
  Administered 2023-09-30 – 2023-10-01 (×2): 1 mg via ORAL
  Filled 2023-09-30: qty 1

## 2023-09-30 MED ORDER — DEXAMETHASONE SODIUM PHOSPHATE 10 MG/ML IJ SOLN
INTRAMUSCULAR | Status: DC | PRN
  Administered 2023-09-30: 8 mg via INTRAVENOUS

## 2023-09-30 MED ORDER — HYDROMORPHONE HCL 1 MG/ML IJ SOLN
0.2500 mg | INTRAMUSCULAR | Status: DC | PRN
  Administered 2023-09-30 (×4): 0.5 mg via INTRAVENOUS

## 2023-09-30 MED ORDER — LORAZEPAM 1 MG PO TABS
0.0000 mg | ORAL_TABLET | Freq: Four times a day (QID) | ORAL | Status: DC
  Filled 2023-09-30: qty 1

## 2023-09-30 MED ORDER — ONDANSETRON HCL 4 MG PO TABS: 4.0000 mg | ORAL_TABLET | Freq: Four times a day (QID) | ORAL | Status: DC | PRN

## 2023-09-30 MED ORDER — ACETAMINOPHEN 10 MG/ML IV SOLN: 1000.0000 mg | Freq: Once | INTRAVENOUS | Status: DC

## 2023-09-30 MED ORDER — LIDOCAINE 2% (20 MG/ML) 5 ML SYRINGE
INTRAMUSCULAR | Status: DC | PRN
  Administered 2023-09-30: 100 mg via INTRAVENOUS

## 2023-09-30 MED ORDER — LORAZEPAM 1 MG PO TABS: 0.0000 mg | ORAL_TABLET | Freq: Two times a day (BID) | ORAL | Status: DC

## 2023-09-30 MED ORDER — PROPOFOL 10 MG/ML IV BOLUS
INTRAVENOUS | Status: DC | PRN
  Administered 2023-09-30: 180 mg via INTRAVENOUS

## 2023-09-30 MED ORDER — ONDANSETRON HCL 4 MG/2ML IJ SOLN: 4.0000 mg | Freq: Four times a day (QID) | INTRAMUSCULAR | Status: DC | PRN

## 2023-09-30 MED ORDER — ALBUMIN HUMAN 5 % IV SOLN: INTRAVENOUS | Status: DC | PRN

## 2023-09-30 MED ORDER — HYDROMORPHONE HCL 1 MG/ML IJ SOLN
INTRAMUSCULAR | Status: AC
  Filled 2023-09-30: qty 1

## 2023-09-30 MED ORDER — 0.9 % SODIUM CHLORIDE (POUR BTL) OPTIME
TOPICAL | Status: DC | PRN
  Administered 2023-09-30: 1000 mL

## 2023-09-30 MED ORDER — FENTANYL CITRATE (PF) 250 MCG/5ML IJ SOLN
INTRAMUSCULAR | Status: DC | PRN
  Administered 2023-09-30 (×2): 100 ug via INTRAVENOUS
  Administered 2023-09-30 (×6): 50 ug via INTRAVENOUS

## 2023-09-30 MED ORDER — ROCURONIUM BROMIDE 10 MG/ML (PF) SYRINGE
PREFILLED_SYRINGE | INTRAVENOUS | Status: DC | PRN
  Administered 2023-09-30: 80 mg via INTRAVENOUS

## 2023-09-30 MED ORDER — ACETAMINOPHEN 10 MG/ML IV SOLN
INTRAVENOUS | Status: AC
  Filled 2023-09-30: qty 100

## 2023-09-30 MED ORDER — MIDAZOLAM HCL 2 MG/2ML IJ SOLN
INTRAMUSCULAR | Status: AC
  Filled 2023-09-30: qty 2

## 2023-09-30 MED ORDER — DEXAMETHASONE SODIUM PHOSPHATE 10 MG/ML IJ SOLN
INTRAMUSCULAR | Status: AC
  Filled 2023-09-30: qty 1

## 2023-09-30 MED ORDER — MEPERIDINE HCL 25 MG/ML IJ SOLN: 6.2500 mg | INTRAMUSCULAR | Status: DC | PRN

## 2023-09-30 MED ORDER — PROMETHAZINE HCL 25 MG/ML IJ SOLN: 12.5000 mg | INTRAMUSCULAR | Status: DC | PRN

## 2023-09-30 MED ORDER — CEFAZOLIN SODIUM-DEXTROSE 2-4 GM/100ML-% IV SOLN
2.0000 g | INTRAVENOUS | Status: AC
  Administered 2023-09-30: 2 g via INTRAVENOUS
  Filled 2023-09-30: qty 100

## 2023-09-30 MED ORDER — CHLORHEXIDINE GLUCONATE 4 % EX SOLN
60.0000 mL | Freq: Once | CUTANEOUS | Status: DC
Start: 2023-09-30 — End: 2023-09-30

## 2023-09-30 MED ORDER — VANCOMYCIN HCL 1000 MG IV SOLR
INTRAVENOUS | Status: DC | PRN
  Administered 2023-09-30: 1000 mg via TOPICAL

## 2023-09-30 MED ORDER — LIDOCAINE 2% (20 MG/ML) 5 ML SYRINGE
INTRAMUSCULAR | Status: AC
  Filled 2023-09-30: qty 5

## 2023-09-30 MED ORDER — POVIDONE-IODINE 10 % EX SWAB
2.0000 | Freq: Once | CUTANEOUS | Status: AC
Start: 2023-09-30 — End: 2023-09-30
  Administered 2023-09-30: 2 via TOPICAL

## 2023-09-30 MED ORDER — AMISULPRIDE (ANTIEMETIC) 5 MG/2ML IV SOLN: 10.0000 mg | Freq: Once | INTRAVENOUS | Status: DC | PRN

## 2023-09-30 MED ORDER — CEFAZOLIN SODIUM-DEXTROSE 2-4 GM/100ML-% IV SOLN
2.0000 g | Freq: Three times a day (TID) | INTRAVENOUS | Status: AC
  Administered 2023-09-30 – 2023-10-01 (×3): 2 g via INTRAVENOUS
  Filled 2023-09-30 (×3): qty 100

## 2023-09-30 MED ORDER — DOCUSATE SODIUM 100 MG PO CAPS
100.0000 mg | ORAL_CAPSULE | Freq: Two times a day (BID) | ORAL | Status: DC
  Administered 2023-09-30 – 2023-10-01 (×2): 100 mg via ORAL
  Filled 2023-09-30 (×3): qty 1

## 2023-09-30 SURGICAL SUPPLY — 59 items
APL PRP STRL LF DISP 70% ISPRP (MISCELLANEOUS) ×2
BIT DRILL CALIBR QC 2.8X250 (BIT) IMPLANT
BIT DRILL QC SFS 2.5X170 (BIT) IMPLANT
BLADE CLIPPER SURG (BLADE) IMPLANT
BLADE SURG 15 STRL LF DISP TIS (BLADE) ×1 IMPLANT
BLADE SURG 15 STRL SS (BLADE) ×1
BNDG CMPR 5X4 KNIT ELC UNQ LF (GAUZE/BANDAGES/DRESSINGS) ×1
BNDG CMPR 6 X 5 YARDS HK CLSR (GAUZE/BANDAGES/DRESSINGS) ×1
BNDG ELASTIC 4INX 5YD STR LF (GAUZE/BANDAGES/DRESSINGS) IMPLANT
BNDG ELASTIC 6INX 5YD STR LF (GAUZE/BANDAGES/DRESSINGS) IMPLANT
BRUSH SCRUB EZ PLAIN DRY (MISCELLANEOUS) ×2 IMPLANT
CHLORAPREP W/TINT 26 (MISCELLANEOUS) ×2 IMPLANT
COVER SURGICAL LIGHT HANDLE (MISCELLANEOUS) ×1 IMPLANT
CUFF TOURN SGL QUICK 34 (TOURNIQUET CUFF) ×1
CUFF TRNQT CYL 34X4.125X (TOURNIQUET CUFF) ×1 IMPLANT
DRAPE C-ARM 42X72 X-RAY (DRAPES) ×1 IMPLANT
DRAPE C-ARMOR (DRAPES) ×1 IMPLANT
DRAPE SURG ORHT 6 SPLT 77X108 (DRAPES) ×2 IMPLANT
DRAPE U-SHAPE 47X51 STRL (DRAPES) ×1 IMPLANT
GAUZE SPONGE 4X4 12PLY STRL (GAUZE/BANDAGES/DRESSINGS) IMPLANT
GLOVE BIO SURGEON STRL SZ 6.5 (GLOVE) ×3 IMPLANT
GLOVE BIO SURGEON STRL SZ7.5 (GLOVE) ×4 IMPLANT
GLOVE BIOGEL PI IND STRL 6.5 (GLOVE) ×1 IMPLANT
GLOVE BIOGEL PI IND STRL 7.5 (GLOVE) ×1 IMPLANT
GOWN STRL REUS W/ TWL LRG LVL3 (GOWN DISPOSABLE) ×2 IMPLANT
GOWN STRL REUS W/TWL LRG LVL3 (GOWN DISPOSABLE) ×2
K-WIRE 1.6X150 (WIRE) ×1
KIT BASIN OR (CUSTOM PROCEDURE TRAY) ×1 IMPLANT
KIT TURNOVER KIT B (KITS) ×1 IMPLANT
KWIRE 1.6X150 (WIRE) IMPLANT
NDL 1/2 CIR CATGUT .05X1.09 (NEEDLE) IMPLANT
NDL SUT 6 .5 CRC .975X.05 MAYO (NEEDLE) ×1 IMPLANT
NEEDLE 1/2 CIR CATGUT .05X1.09 (NEEDLE) ×1 IMPLANT
NEEDLE MAYO TAPER (NEEDLE) ×1
NS IRRIG 1000ML POUR BTL (IV SOLUTION) ×1 IMPLANT
PACK TOTAL JOINT (CUSTOM PROCEDURE TRAY) ×1 IMPLANT
PAD ARMBOARD 7.5X6 YLW CONV (MISCELLANEOUS) ×2 IMPLANT
PAD CAST 4YDX4 CTTN HI CHSV (CAST SUPPLIES) IMPLANT
PADDING CAST COTTON 4X4 STRL (CAST SUPPLIES) ×1
PADDING CAST COTTON 6X4 STRL (CAST SUPPLIES) IMPLANT
PIN SCHANZ 4.0X125MM (PIN) IMPLANT
PLATE PROX TIBIA LEFT (Plate) IMPLANT
SCREW CORTEX 3.5 45MM (Screw) IMPLANT
SCREW CORTEX 3.5X75MM (Screw) IMPLANT
SCREW CORTEX 3.5X80MM (Screw) IMPLANT
SCREW LOCK CORT ST 3.5X36 (Screw) IMPLANT
SCREW LOCK CORT ST 3.5X40 (Screw) IMPLANT
SCREW LOCKING 3.5X80MM VA (Screw) IMPLANT
SCREW LOCKING VA 3.5X75MM (Screw) IMPLANT
SCREW LOCKING VA 3.5X85MM (Screw) IMPLANT
STRIP CLOSURE SKIN 1/2X4 (GAUZE/BANDAGES/DRESSINGS) IMPLANT
SUCTION TUBE FRAZIER 10FR DISP (SUCTIONS) ×1 IMPLANT
SUT ETHILON 3 0 PS 1 (SUTURE) IMPLANT
SUT MNCRL AB 3-0 PS2 18 (SUTURE) IMPLANT
SUT MON AB 2-0 CT1 36 (SUTURE) IMPLANT
SUT VIC AB 1 CT1 27 (SUTURE) ×1
SUT VIC AB 1 CT1 27XBRD ANBCTR (SUTURE) ×1 IMPLANT
TOWEL GREEN STERILE (TOWEL DISPOSABLE) ×2 IMPLANT
WATER STERILE IRR 1000ML POUR (IV SOLUTION) ×2 IMPLANT

## 2023-09-30 NOTE — Transfer of Care (Signed)
Immediate Anesthesia Transfer of Care Note  Patient: Earl [name] [facility]  Procedure(s) Performed: OPEN REDUCTION INTERNAL FIXATION LEFT TIBIAL PLATEAU (Left: Leg Lower)  Patient Location: PACU  Anesthesia Type:General  Level of Consciousness: awake and patient cooperative  Airway & Oxygen Therapy: Patient Spontanous Breathing and Patient connected to nasal cannula oxygen  Post-op Assessment: Report given to RN and Post -op Vital signs reviewed and stable  Post vital signs: Reviewed and stable  Last Vitals:  Vitals Value Taken Time  BP 139/90 09/30/23 1032  Temp 36.3 C 09/30/23 1032  Pulse 91 09/30/23 1040  Resp 19 09/30/23 1040  SpO2 100 % 09/30/23 1040  Vitals shown include unfiled device data.  Last Pain:  Vitals:   09/30/23 1032  TempSrc:   PainSc: 10-Worst pain ever      Patients Stated Pain Goal: 0 (09/29/23 0519)  Complications: No notable events documented.

## 2023-09-30 NOTE — Progress Notes (Signed)
Orthopedic Tech Progress Note Patient Details:  [name] [facility] Sep 17, 1996 [id number]  RN called requesting an OVER HEAD FRAME for patient. Need to make sure due to patient having CHEST TUBE it will it be safe for usage   Patient ID: Earl [name], male   DOB: 1996-07-03, 27 y.o.   MRN: [id number]  Donald Pore 09/30/2023, 11:23 PM

## 2023-09-30 NOTE — Plan of Care (Signed)
  Problem: Education: Goal: Knowledge of General Education information will improve Description: Including pain rating scale, medication(s)/side effects and non-pharmacologic comfort measures Outcome: Progressing   Problem: Clinical Measurements: Goal: Ability to maintain clinical measurements within normal limits will improve Outcome: Progressing Goal: Will remain free from infection Outcome: Progressing   Problem: Activity: Goal: Risk for activity intolerance will decrease Outcome: Progressing   Problem: Nutrition: Goal: Adequate nutrition will be maintained Outcome: Progressing   Problem: Elimination: Goal: Will not experience complications related to bowel motility Outcome: Progressing   Problem: Pain Management: Goal: General experience of comfort will improve Outcome: Progressing   Problem: Safety: Goal: Ability to remain free from injury will improve Outcome: Progressing   Problem: Skin Integrity: Goal: Risk for impaired skin integrity will decrease Outcome: Progressing

## 2023-09-30 NOTE — Anesthesia Procedure Notes (Signed)
Procedure Name: Intubation Date/Time: 09/30/2023 8:44 AM  Performed by: Hilda Lias, CRNAPre-anesthesia Checklist: Patient identified, Emergency Drugs available, Suction available and Patient being monitored Patient Re-evaluated:Patient Re-evaluated prior to induction Oxygen Delivery Method: Circle System Utilized Preoxygenation: Pre-oxygenation with 100% oxygen Induction Type: IV induction Ventilation: Mask ventilation without difficulty Laryngoscope Size: Mac and 3 Grade View: Grade I Tube type: Oral Number of attempts: 1 Airway Equipment and Method: Stylet and Oral airway Placement Confirmation: ETT inserted through vocal cords under direct vision, positive ETCO2 and breath sounds checked- equal and bilateral Secured at: 23 cm Tube secured with: Tape Dental Injury: Teeth and Oropharynx as per pre-operative assessment

## 2023-09-30 NOTE — Progress Notes (Signed)
Orthopedic Tech Progress Note Patient Details:  Earl [name] [facility] 10/22/1996 [id number] LLE Bledsoe brace has been ordered from [facility]  Patient ID: Earl [name], male   DOB: 10/17/1996, 27 y.o.   MRN: [id number]  Earl [name] 09/30/2023, 10:52 AM
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
## 2023-09-30 NOTE — Discharge Instructions (Addendum)
Orthopaedic Trauma Service Discharge Instructions   General Discharge Instructions  WEIGHT BEARING STATUS:Non-weightbearing left lower extremity in knee brace  RANGE OF MOTION/ACTIVITY: ok for unrestricted knee motion in brace  Wound Care: You may remove your surgical dressing on post op day 2, (Friday 10/02/23). Incisions can be left open to air if there is no drainage. Once the incision is completely dry and without drainage, it may be left open to air out.  Showering may begin post op day 3, (Saturday 10/03/23).  Clean incision gently with soap and water. YOU MAY REMOVE YOUR KNEE BRACE FOR SHOWERING  DVT/PE prophylaxis: Aspirin 325 mg daily x 30 days  Diet: as you were eating previously.  Can use over the counter stool softeners and bowel preparations, such as Miralax, to help with bowel movements.  Narcotics can be constipating.  Be sure to drink plenty of fluids  PAIN MEDICATION USE AND EXPECTATIONS  You have likely been given narcotic medications to help control your pain.  After a traumatic event that results in an fracture (broken bone) with or without surgery, it is ok to use narcotic pain medications to help control one's pain.  We understand that everyone responds to pain differently and each individual patient will be evaluated on a regular basis for the continued need for narcotic medications. Ideally, narcotic medication use should last no more than 6-8 weeks (coinciding with fracture healing).   As a patient it is your responsibility as well to monitor narcotic medication use and report the amount and frequency you use these medications when you come to your office visit.   We would also advise that if you are using narcotic medications, you should take a dose prior to therapy to maximize you participation.  IF YOU ARE ON NARCOTIC MEDICATIONS IT IS NOT PERMISSIBLE TO OPERATE A MOTOR VEHICLE (MOTORCYCLE/CAR/TRUCK/MOPED) OR HEAVY MACHINERY DO NOT MIX NARCOTICS WITH OTHER CNS  (CENTRAL NERVOUS SYSTEM) DEPRESSANTS SUCH AS ALCOHOL   STOP SMOKING OR USING NICOTINE PRODUCTS!!!!  As discussed nicotine severely impairs your body's ability to heal surgical and traumatic wounds but also impairs bone healing.  Wounds and bone heal by forming microscopic blood vessels (angiogenesis) and nicotine is a vasoconstrictor (essentially, shrinks blood vessels).  Therefore, if vasoconstriction occurs to these microscopic blood vessels they essentially disappear and are unable to deliver necessary nutrients to the healing tissue.  This is one modifiable factor that you can do to dramatically increase your chances of healing your injury.    (This means no smoking, no nicotine gum, patches, etc)  DO NOT USE NONSTEROIDAL ANTI-INFLAMMATORY DRUGS (NSAID'S)  Using products such as Advil (ibuprofen), Aleve (naproxen), Motrin (ibuprofen) for additional pain control during fracture healing can delay and/or prevent the healing response.  If you would like to take over the counter (OTC) medication, Tylenol (acetaminophen) is ok.  However, some narcotic medications that are given for pain control contain acetaminophen as well. Therefore, you should not exceed more than 4000 mg of tylenol in a day if you do not have liver disease.  Also note that there are may OTC medicines, such as cold medicines and allergy medicines that my contain tylenol as well.  If you have any questions about medications and/or interactions please ask your doctor/PA or your pharmacist.      ICE AND ELEVATE INJURED/OPERATIVE EXTREMITY  Using ice and elevating the injured extremity above your heart can help with swelling and pain control.  Icing in a pulsatile fashion, such as 20 minutes  on and 20 minutes off, can be followed.    Do not place ice directly on skin. Make sure there is a barrier between to skin and the ice pack.    Using frozen items such as frozen peas works well as the conform nicely to the are that needs to be  iced.  USE AN ACE WRAP OR TED HOSE FOR SWELLING CONTROL  In addition to icing and elevation, Ace wraps or TED hose are used to help limit and resolve swelling.  It is recommended to use Ace wraps or TED hose until you are informed to stop.    When using Ace Wraps start the wrapping distally (farthest away from the body) and wrap proximally (closer to the body)   Example: If you had surgery on your leg or thing and you do not have a splint on, start the ace wrap at the toes and work your way up to the thigh        If you had surgery on your upper extremity and do not have a splint on, start the ace wrap at your fingers and work your way up to the upper arm    CALL THE OFFICE FOR MEDICATION REFILLS OR WITH ANY QUESTIONS/CONCERNS: [phone]   VISIT OUR WEBSITE FOR ADDITIONAL INFORMATION: [email or web address]   Discharge Wound Care Instructions  Do NOT apply any ointments, solutions or lotions to pin sites or surgical wounds.  These prevent needed drainage and even though solutions like hydrogen peroxide kill bacteria, they also damage cells lining the pin sites that help fight infection.  Applying lotions or ointments can keep the wounds moist and can cause them to breakdown and open up as well. This can increase the risk for infection. When in doubt call the office.  Surgical incisions should be dressed daily.  If any drainage is noted, use one layer of adaptic or Mepitel, then gauze, Kerlix, and an ace wrap. - These dressing supplies should be available at local medical supply stores [facility], [facility], etc) as well as Insurance claims handler ([employer], [facility], [place], etc)  Once the incision is completely dry and without drainage, it may be left open to air out.  Showering may begin 36-48 hours later.  Cleaning gently with soap and water.  PTSD  Brochure [email or web address]  Tra[facility] Resources [facility]:  Address: [address], [place], [place] [address] Hours:  Closes 5?PM Phone: [phone]  [facility] [place]:  Located in: [facility] Address: [address], [place], [place] [address] Areas served:  [employer]:  Closes 8?PM  Phone: [phone]  Trauma Focused Therapist(s):   Dannielle Karvonen [address] [place], [place] [address]  [phone] .[facility] [place], [place] [address]  [phone] .com  [facility] Health Counselor, [place], [facility], [facility], [facility] [phone] and Pamala Duffel [place], [place] [address] [phone]  [facility] [address] [place], [place] [address]  [phone]   [facility] [address] [place], [place] [address] [place] Phone: [phone]  Trauma Support Groups [facility] [address] [place], [place] [address]   [facility] [address] [place]   Phone: [phone]  [facility]  [phone] [address] [address] [place], [place] [address]  [facility]Emotional Abuse Survivor) Lind Guest [phone]  [facility] [place], [place] [address]  [phone]  Mental Health Services [facility] [address] [place] [address] [phone]  60 Suicide and Crisis Lifeline 988 [email or web address]/  [facility] Website: [email or web address]   24-Hour Call Center: [phone]   Behavioral Health Crisis Line: [phone]   [facility]  Website: TripDoors.com.cy  Address: [address] [place], [place], [place] [address]  Contact: [phone]  [facility] Website: [email or web address]  Address: [address], [place], [place] [address]  Contact: [phone]

## 2023-09-30 NOTE — Progress Notes (Signed)
PT Cancellation Note  Patient Details Name: Earl [name] MRN: [id number] DOB: 01/25/1996   Cancelled Treatment:    Reason Eval/Treat Not Completed: Patient at procedure or test/unavailable (Pt in OR for ORIF left bicondylar tibial plateau fracture. Will follow up at later date/time as schedule allows and pt able.)   Renaldo Fiddler PT, DPT Acute Rehabilitation Services Office [phone]  09/30/23 11:06 AM

## 2023-09-30 NOTE — Anesthesia Preprocedure Evaluation (Addendum)
Anesthesia Evaluation  Patient identified by MRN, date of birth, ID band Patient awake    Reviewed: Allergy & Precautions, H&P , NPO status , Patient's Chart, lab work & pertinent test results  Airway Mallampati: II  TM Distance: >3 FB Neck ROM: Full    Dental no notable dental hx.    Pulmonary neg pulmonary ROS, Current Smoker and Patient abstained from smoking.   Pulmonary exam normal breath sounds clear to auscultation       Cardiovascular negative cardio ROS Normal cardiovascular exam Rhythm:Regular Rate:Normal     Neuro/Psych Seizures -,   negative psych ROS   GI/Hepatic negative GI ROS, Neg liver ROS,,,  Endo/Other  negative endocrine ROS    Renal/GU negative Renal ROS  negative genitourinary   Musculoskeletal negative musculoskeletal ROS (+)    Abdominal   Peds negative pediatric ROS (+)  Hematology negative hematology ROS (+)   Anesthesia Other Findings   Reproductive/Obstetrics negative OB ROS                             Anesthesia Physical Anesthesia Plan  ASA: 3  Anesthesia Plan: General   Post-op Pain Management: Tylenol PO (pre-op)*   Induction: Intravenous  PONV Risk Score and Plan: 1 and Ondansetron and Treatment may vary due to age or medical condition  Airway Management Planned: Oral ETT  Additional Equipment:   Intra-op Plan:   Post-operative Plan: Extubation in OR  Informed Consent: I have reviewed the patients History and Physical, chart, labs and discussed the procedure including the risks, benefits and alternatives for the proposed anesthesia with the patient or authorized representative who has indicated his/her understanding and acceptance.     Dental advisory given  Plan Discussed with: CRNA  Anesthesia Plan Comments:        Anesthesia Quick Evaluation

## 2023-09-30 NOTE — Interval H&P Note (Signed)
History and Physical Interval Note:  09/30/2023 8:18 AM  Earl [name]  has presented today for surgery, with the diagnosis of Left tibial plateau fracture.  The various methods of treatment have been discussed with the patient and family. After consideration of risks, benefits and other options for treatment, the patient has consented to  Procedure(s): OPEN REDUCTION INTERNAL FIXATION (ORIF) TIBIAL PLATEAU (Left) as a surgical intervention.  The patient's history has been reviewed, patient examined, no change in status, stable for surgery.  I have reviewed the patient's chart and labs.  Questions were answered to the patient's satisfaction.     Caryn Bee P Arian Murley

## 2023-09-30 NOTE — Anesthesia Postprocedure Evaluation (Signed)
Anesthesia Post Note  Patient: Earl [name] [facility]  Procedure(s) Performed: OPEN REDUCTION INTERNAL FIXATION LEFT TIBIAL PLATEAU (Left: Leg Lower)     Patient location during evaluation: PACU Anesthesia Type: General Level of consciousness: awake and alert Pain management: pain level controlled Vital Signs Assessment: post-procedure vital signs reviewed and stable Respiratory status: spontaneous breathing, nonlabored ventilation and respiratory function stable Cardiovascular status: blood pressure returned to baseline and stable Postop Assessment: no apparent nausea or vomiting Anesthetic complications: no   No notable events documented.  Last Vitals:  Vitals:   09/30/23 1100 09/30/23 1156  BP: (!) 157/95 (!) 155/105  Pulse: 74 79  Resp: 13   Temp: 36.7 C 36.7 C  SpO2: 99% 100%    Last Pain:  Vitals:   09/30/23 1159  TempSrc:   PainSc: 10-Worst pain ever                 Lowella Curb
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
## 2023-09-30 NOTE — Consult Note (Signed)
Patient was an operating room today attempted assessment.  Psychiatry will plan to see on 10/01/2023 for consult regarding acute stress disorder.

## 2023-10-01 ENCOUNTER — Inpatient Hospital Stay (HOSPITAL_COMMUNITY): Payer: No Typology Code available for payment source

## 2023-10-01 ENCOUNTER — Encounter (HOSPITAL_COMMUNITY): Payer: Self-pay | Admitting: Student

## 2023-10-01 DIAGNOSIS — F430 Acute stress reaction: Secondary | ICD-10-CM

## 2023-10-01 DIAGNOSIS — J939 Pneumothorax, unspecified: Secondary | ICD-10-CM

## 2023-10-01 MED ORDER — ESCITALOPRAM OXALATE 10 MG PO TABS
10.0000 mg | ORAL_TABLET | Freq: Every day | ORAL | Status: AC
  Administered 2023-10-01: 10 mg via ORAL
  Filled 2023-10-01: qty 1

## 2023-10-01 MED ORDER — KETOROLAC TROMETHAMINE 15 MG/ML IJ SOLN
15.0000 mg | Freq: Four times a day (QID) | INTRAMUSCULAR | Status: AC
  Administered 2023-10-01 – 2023-10-02 (×5): 15 mg via INTRAVENOUS
  Filled 2023-10-01 (×5): qty 1

## 2023-10-01 MED ORDER — ESCITALOPRAM OXALATE 20 MG PO TABS
20.0000 mg | ORAL_TABLET | Freq: Every day | ORAL | Status: DC
  Filled 2023-10-01: qty 1

## 2023-10-01 MED ORDER — HYDROMORPHONE HCL 1 MG/ML IJ SOLN: 0.5000 mg | INTRAMUSCULAR | Status: DC | PRN

## 2023-10-01 MED ORDER — OXYCODONE HCL 5 MG PO TABS: 10.0000 mg | ORAL_TABLET | ORAL | Status: DC | PRN

## 2023-10-01 MED ORDER — OXYCODONE HCL 5 MG PO TABS
5.0000 mg | ORAL_TABLET | ORAL | Status: DC | PRN
  Administered 2023-10-02: 10 mg via ORAL
  Filled 2023-10-01: qty 2
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
## 2023-10-01 NOTE — Progress Notes (Addendum)
Orthopaedic Trauma Progress Note  SUBJECTIVE: Doing fair this morning.  Initially notes soreness in the left leg but then described it as 10/10 pain.  I have adjusted oral meds and added Toradol to hopefully achieve better pain control.  Denies any significant numbness or tingling throughout the leg.  Has not been up out of bed yet since surgery.  As tolerated in the brace.  Ace wrap feels snug but not too tight.  No chest pain. No SOB. No nausea/vomiting. No other complaints.   OBJECTIVE:  Vitals:   10/01/23 0422 10/01/23 0806  BP: 137/86 (!) 142/86  Pulse: (!) 103 95  Resp: 18 18  Temp: 99.5 F (37.5 C)   SpO2: 98% 100%    General: Resting in bed.  No acute distress Respiratory: No increased work of breathing.  Left lower extremity: Bledsoe brace in place, unlocked.  Dressing clean, dry, intact.  Tender about the knee as expected.  Lower leg swollen but compartments are compressible.  Ankle DF/PF intact.  Able to wiggle toes.  Endorses sensation light touch over all aspects of the foot.  Toes warm and well-perfused. + DP pulse IMAGING: Stable post op imaging.   LABS: No results found for this or any previous visit (from the past 24 hour(s)).  ASSESSMENT: Earl [name] is a 27 y.o. male, 1 Day Post-Op s/p OPEN REDUCTION INTERNAL FIXATION LEFT TIBIAL PLATEAU  CV/Blood loss: Hemoglobin 12.7 on 09/30/2023.  AM labs pending.  Hemodynamically stable  PLAN: Weightbearing: NWB LLE in Bledsoe brace ROM: Unrestricted ROM in brace Incisional and dressing care: Reinforce dressings as needed  Showering: Okay to begin showering and getting incisions wet 10/03/2023 Orthopedic device(s): Bledsoe brace LLE Pain management:  1. Tylenol 1000 mg q 6 hours scheduled 2. Robaxin 500 mg QID 3. Oxycodone 5-15 mg q 4 hours PRN 4. Dilaudid 0.5 mg q 3 hours PRN 5.  Toradol 15 mg every 6 hours x 5 doses VTE prophylaxis: Lovenox, SCDs ID:  Ancef 2gm post op Foley/Lines:  No foley, KVO IVFs Impediments  to Fracture Healing: Vitamin D level pending, will start supplementation as indicated. Dispo: PT/OT evaluation today.  Patient plan to stay with his friend at discharge.  Plan to remove dressing LLE tomorrow 10/02/2023.  Okay for discharge from ortho standpoint once cleared by trauma team and therapies  D/C recommendations: -Oxycodone, Robaxin, Tylenol for pain control -Aspirin 325 mg daily x 30 days for DVT prophylaxis -Possible need for Vit D supplementation  Follow - up plan: 2 weeks   Contact information:  Truitt Merle MD, Thyra Breed PA-C. After hours and holidays please check [email or web address] for group call information for [facility]   Thompson Caul, PA-C [phone] (office) [email or web address]
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
## 2023-10-02 ENCOUNTER — Other Ambulatory Visit (HOSPITAL_COMMUNITY): Payer: Self-pay

## 2023-10-02 MED ORDER — HYDROXYZINE HCL 10 MG PO TABS
10.0000 mg | ORAL_TABLET | Freq: Three times a day (TID) | ORAL | 0 refills | Status: AC | PRN
  Filled 2023-10-02: qty 30, 10d supply, fill #0

## 2023-10-02 MED ORDER — ESCITALOPRAM OXALATE 20 MG PO TABS
20.0000 mg | ORAL_TABLET | Freq: Every day | ORAL | 0 refills | Status: AC
  Filled 2023-10-02: qty 30, 30d supply, fill #0

## 2023-10-02 MED ORDER — LOPERAMIDE HCL 2 MG PO CAPS
2.0000 mg | ORAL_CAPSULE | ORAL | Status: DC | PRN
  Administered 2023-10-02: 2 mg via ORAL
  Filled 2023-10-02: qty 1

## 2023-10-02 MED ORDER — METHOCARBAMOL 500 MG PO TABS
1000.0000 mg | ORAL_TABLET | Freq: Four times a day (QID) | ORAL | 0 refills | Status: AC
  Filled 2023-10-02: qty 120, 15d supply, fill #0

## 2023-10-02 MED ORDER — ASPIRIN 325 MG PO TBEC
325.0000 mg | DELAYED_RELEASE_TABLET | Freq: Every day | ORAL | 0 refills | Status: AC
Start: 2023-10-02 — End: 2023-11-01
  Filled 2023-10-02: qty 30, 30d supply, fill #0

## 2023-10-02 MED ORDER — HYDROXYZINE HCL 10 MG PO TABS
10.0000 mg | ORAL_TABLET | Freq: Three times a day (TID) | ORAL | Status: DC | PRN
  Administered 2023-10-02: 10 mg via ORAL
  Filled 2023-10-02: qty 1

## 2023-10-02 MED ORDER — LOPERAMIDE HCL 2 MG PO CAPS: 2.0000 mg | ORAL_CAPSULE | ORAL | Status: AC | PRN

## 2023-10-02 MED ORDER — ACETAMINOPHEN 500 MG PO TABS: 1000.0000 mg | ORAL_TABLET | Freq: Four times a day (QID) | ORAL | Status: AC

## 2023-10-02 MED ORDER — OXYCODONE HCL 5 MG PO TABS
10.0000 mg | ORAL_TABLET | ORAL | 0 refills | Status: AC | PRN
  Filled 2023-10-02: qty 60, 6d supply, fill #0

## 2023-10-02 MED ORDER — IBUPROFEN 800 MG PO TABS
800.0000 mg | ORAL_TABLET | Freq: Three times a day (TID) | ORAL | 0 refills | Status: AC | PRN
  Filled 2023-10-02: qty 30, 10d supply, fill #0

## 2023-10-02 NOTE — Progress Notes (Signed)
PT Cancellation Note  Patient Details Name: Earl [name] MRN: [id number] DOB: October 04, 1996   Cancelled Treatment:    Reason Eval/Treat Not Completed: (P) Patient's level of consciousness (pt sleeping, not easily awoken to loud voice, door left open ~5 mins while PTA typing by door and patient still not awakening, per chart review pt had been requesting to nap.) Will continue efforts per PT plan of care as schedule permits.   Dorathy Kinsman Laquenta Whitsell 10/02/2023, 12:46 PM
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
## 2023-10-02 NOTE — Progress Notes (Addendum)
Discharge instructions (including medications) discussed with and copy provided to patient. Patient was able to verbalize understanding. PIV x1 removed and patient dressed himself. Walker to be delivered to room. TOC Medications given to patient at discharge. All belongings sent with patient.   Unable to tell patient specific time for walker delivery. Patient stated "I have to leave for a meeting with a social worker about my children, and cannot miss it." Patient agreed to return this evening for the walker. Education was reinforced about his post-op restrictions and he verbalized agreement and understanding. The friend who gave him a ride also told about the walker and assured she would return for it.

## 2023-10-02 NOTE — Progress Notes (Signed)
OT Cancellation Note  Patient Details Name: Earl [name] MRN: [id number] DOB: 01-26-1996   Cancelled Treatment:    Reason Eval/Treat Not Completed: Patient declined, no reason specified (1st attempt patient stated he was sleepy and asked therapist to come back in 30 minutes. COTA returned in 30 minutes and patient states he is dizzy and tired and did not want to participate.) Alfonse Flavors, OTA Acute Rehabilitation Services  Office [phone]  Dewain Penning 10/02/2023, 1:45 PM

## 2023-10-02 NOTE — TOC Transition Note (Signed)
Transition of Care [facility]) - CM/SW Discharge Note   Patient Details  Name: Earl [name] MRN: [id number] Date of Birth: 16-Dec-1995  [facility] of [facility]) CM/SW Contact:  Glennon Mac, RN Phone Number: 10/02/2023, 3:09 PM   Clinical Narrative:    Patient medically stable for discharge home today; he states he still plans to discharge to his friend Emily's home to recover.  PT/OT recommending OP follow up once cleared by surgeon at follow up. Referral to [employer] for RW, to be delivered to patient's room prior to dc.  Patient states he is not sure if he has a ride, but will call friends to see if they will come get him.  Please notify case manager if patient unable to arrange transportation, AFTER he makes an effort to find a ride himself.    Final next level of care: Home/Self Care Barriers to Discharge: Barriers Resolved                       Discharge Plan and Services Additional resources added to the After Visit Summary for     Discharge Planning Services: CM Consult            DME Arranged: Dan Humphreys rolling DME Agency: [employer] Date DME Agency Contacted: 10/02/23 Time DME Agency Contacted: [phone] Representative spoke with at DME Agency: Mickeal Needy            Social Determinants of Health (SDOH) Interventions SDOH Screenings   Food Insecurity: No Food Insecurity (09/26/2023)  Housing: High Risk (09/26/2023)  Transportation Needs: No Transportation Needs (09/26/2023)  Utilities: Not At Risk (09/26/2023)  Tobacco Use: High Risk (09/30/2023)     Readmission Risk Interventions     No data to display         Quintella Baton, RN, BSN  Trauma/Neuro ICU Case Manager [phone]

## 2023-10-02 NOTE — Progress Notes (Signed)
Orthopaedic Trauma Progress Note  SUBJECTIVE: Doing fair this morning.  States he just wants to sleep.  Notes he is withdrawing from fentanyl.  States he uses fentanyl on a daily basis.  He has been to the inpatient drug rehab program in the past, has been over a year since last being hospitalized for this.  States he has been vomiting for the past 2 days and hasn't been able to eat. No chest pain. No SOB. No other complaints.   OBJECTIVE:  Vitals:   10/02/23 0408 10/02/23 0831  BP: (!) 144/99 (!) 149/99  Pulse: 88 88  Resp: 18 18  Temp: 99.2 F (37.3 C) 98.5 F (36.9 C)  SpO2: 100% 99%    General: Resting in bed.  No acute distress Respiratory: No increased work of breathing.  Left lower extremity: Bledsoe brace in place, unlocked.  Dressing changed, incisions are clean, dry, intact with Steri-Strips in place.  Mildly tender about the knee as expected.  Compartments soft compressible.  Ankle DF/PF intact.  Able to wiggle toes.  Endorses sensation light touch over all aspects of the foot.  Toes warm and well-perfused. + DP pulse  IMAGING: Stable post op imaging.   LABS: No results found for this or any previous visit (from the past 24 hour(s)).  ASSESSMENT: Earl [name] is a 27 y.o. male, 2 Days Post-Op s/p OPEN REDUCTION INTERNAL FIXATION LEFT TIBIAL PLATEAU  CV/Blood loss: Hemoglobin 12.7 on 09/30/2023.  AM labs pending.  Hemodynamically stable  PLAN: Weightbearing: NWB LLE in Bledsoe brace ROM: Unrestricted ROM in brace Incisional and dressing care: Reinforce dressings as needed  Showering: Okay to begin showering and getting incisions wet 10/03/2023 Orthopedic device(s): Bledsoe brace LLE Pain management:  1. Tylenol 1000 mg q 6 hours scheduled 2. Robaxin 500 mg QID 3. Oxycodone 5-15 mg q 4 hours PRN 4. Dilaudid 0.5 mg q 3 hours PRN 5.  Toradol 15 mg every 6 hours x 5 doses VTE prophylaxis: Lovenox, SCDs ID:  Ancef 2gm post op completed Foley/Lines:  No foley, KVO  IVFs Impediments to Fracture Healing: Vitamin D level pending, will start supplementation as indicated. Dispo: PT/OT evaluation ongoing. Okay for discharge from ortho standpoint once cleared by trauma team and therapies  D/C recommendations: -Oxycodone, Robaxin, Tylenol for pain control -Aspirin 325 mg daily x 30 days for DVT prophylaxis -Possible need for Vit D supplementation  Follow - up plan: 2 weeks after discharge for wound check and repeat x-rays   Contact information:  Truitt Merle MD, Thyra Breed PA-C. After hours and holidays please check [email or web address] for group call information for [facility]   Thompson Caul, PA-C [phone] (office) [email or web address]

## 2023-12-30 ENCOUNTER — Ambulatory Visit: Payer: MEDICAID | Admitting: Physical Therapy

## 2024-10-24 ENCOUNTER — Encounter (HOSPITAL_BASED_OUTPATIENT_CLINIC_OR_DEPARTMENT_OTHER): Payer: Self-pay

## 2024-10-24 ENCOUNTER — Emergency Department (HOSPITAL_BASED_OUTPATIENT_CLINIC_OR_DEPARTMENT_OTHER)
Admission: EM | Admit: 2024-10-24 | Discharge: 2024-10-24 | Disposition: A | Discharge status: Home / Self Care | Payer: MEDICAID | Attending: Emergency Medicine | Admitting: Emergency Medicine

## 2024-10-24 ENCOUNTER — Emergency Department (HOSPITAL_BASED_OUTPATIENT_CLINIC_OR_DEPARTMENT_OTHER): Payer: MEDICAID

## 2024-10-24 ENCOUNTER — Other Ambulatory Visit: Payer: Self-pay

## 2024-10-24 DIAGNOSIS — M25562 Pain in left knee: Secondary | ICD-10-CM | POA: Diagnosis present

## 2024-10-24 DIAGNOSIS — Z91010 Allergy to peanuts: Secondary | ICD-10-CM | POA: Diagnosis not present

## 2024-10-24 MED ORDER — LIDOCAINE 5 % EX PTCH
1.0000 | MEDICATED_PATCH | CUTANEOUS | Status: DC
  Administered 2024-10-24: 1 via TRANSDERMAL
  Filled 2024-10-24: qty 1

## 2024-10-24 MED ORDER — LIDOCAINE 5 % EX PTCH: 1.0000 | MEDICATED_PATCH | CUTANEOUS | 0 refills | Status: AC

## 2024-10-24 MED ORDER — ACETAMINOPHEN 500 MG PO TABS
1000.0000 mg | ORAL_TABLET | Freq: Once | ORAL | Status: AC
  Administered 2024-10-24: 1000 mg via ORAL
  Filled 2024-10-24: qty 2

## 2024-10-24 NOTE — ED Notes (Signed)
XR at the beside
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
